# Patient Record
Sex: Female | Born: 1975 | Hispanic: No | Marital: Married | Smoking: Never smoker
Health system: Southern US, Community
[De-identification: ages and names within clinical notes are randomized; demographics above are authoritative.]

## PROBLEM LIST (undated history)

## (undated) DIAGNOSIS — K2970 Gastritis, unspecified, without bleeding: Secondary | ICD-10-CM

## (undated) DIAGNOSIS — N189 Chronic kidney disease, unspecified: Secondary | ICD-10-CM

## (undated) HISTORY — PX: CHOLECYSTECTOMY: SHX55

## (undated) HISTORY — DX: Chronic kidney disease, unspecified: N18.9

## (undated) HISTORY — PX: TUBAL LIGATION: SHX77

## (undated) HISTORY — PX: KIDNEY TRANSPLANT: SHX239

---

## 1998-12-31 ENCOUNTER — Emergency Department (HOSPITAL_COMMUNITY): Admission: EM | Admit: 1998-12-31 | Discharge: 1998-12-31 | Payer: Self-pay | Admitting: Internal Medicine

## 1999-01-07 ENCOUNTER — Ambulatory Visit (HOSPITAL_COMMUNITY): Admission: RE | Admit: 1999-01-07 | Discharge: 1999-01-07 | Payer: Self-pay | Admitting: Gynecology

## 1999-01-07 ENCOUNTER — Other Ambulatory Visit: Admission: RE | Admit: 1999-01-07 | Discharge: 1999-01-07 | Payer: Self-pay | Admitting: Gynecology

## 1999-01-07 ENCOUNTER — Encounter: Payer: Self-pay | Admitting: Gynecology

## 2000-03-27 ENCOUNTER — Encounter: Admission: RE | Admit: 2000-03-27 | Discharge: 2000-03-27 | Payer: Self-pay | Admitting: Sports Medicine

## 2000-04-16 ENCOUNTER — Encounter: Admission: RE | Admit: 2000-04-16 | Discharge: 2000-04-16 | Payer: Self-pay | Admitting: Family Medicine

## 2000-05-04 ENCOUNTER — Encounter: Admission: RE | Admit: 2000-05-04 | Discharge: 2000-05-04 | Payer: Self-pay | Admitting: Family Medicine

## 2000-05-07 ENCOUNTER — Inpatient Hospital Stay (HOSPITAL_COMMUNITY): Admission: EM | Admit: 2000-05-07 | Discharge: 2000-05-11 | Payer: Self-pay | Admitting: Emergency Medicine

## 2000-05-07 ENCOUNTER — Encounter (INDEPENDENT_AMBULATORY_CARE_PROVIDER_SITE_OTHER): Payer: Self-pay | Admitting: *Deleted

## 2000-05-08 ENCOUNTER — Encounter: Payer: Self-pay | Admitting: Family Medicine

## 2000-05-22 ENCOUNTER — Encounter (HOSPITAL_COMMUNITY): Admission: RE | Admit: 2000-05-22 | Discharge: 2000-05-29 | Payer: Self-pay | Admitting: *Deleted

## 2000-06-05 ENCOUNTER — Ambulatory Visit: Admission: RE | Admit: 2000-06-05 | Discharge: 2000-06-05 | Payer: Self-pay | Admitting: Obstetrics

## 2000-06-12 ENCOUNTER — Encounter (HOSPITAL_COMMUNITY): Admission: RE | Admit: 2000-06-12 | Discharge: 2000-08-13 | Payer: Self-pay | Admitting: *Deleted

## 2000-07-17 ENCOUNTER — Inpatient Hospital Stay (HOSPITAL_COMMUNITY): Admission: AD | Admit: 2000-07-17 | Discharge: 2000-07-17 | Payer: Self-pay | Admitting: *Deleted

## 2000-08-07 ENCOUNTER — Inpatient Hospital Stay (HOSPITAL_COMMUNITY): Admission: AD | Admit: 2000-08-07 | Discharge: 2000-08-07 | Payer: Self-pay | Admitting: *Deleted

## 2000-08-07 ENCOUNTER — Encounter (INDEPENDENT_AMBULATORY_CARE_PROVIDER_SITE_OTHER): Payer: Self-pay

## 2000-08-07 ENCOUNTER — Encounter: Payer: Self-pay | Admitting: *Deleted

## 2000-08-07 ENCOUNTER — Inpatient Hospital Stay (HOSPITAL_COMMUNITY): Admission: AD | Admit: 2000-08-07 | Discharge: 2000-08-13 | Payer: Self-pay | Admitting: *Deleted

## 2000-08-09 ENCOUNTER — Encounter: Payer: Self-pay | Admitting: Obstetrics & Gynecology

## 2000-08-10 ENCOUNTER — Encounter: Payer: Self-pay | Admitting: Obstetrics & Gynecology

## 2000-08-12 ENCOUNTER — Encounter: Payer: Self-pay | Admitting: Obstetrics & Gynecology

## 2000-08-15 ENCOUNTER — Inpatient Hospital Stay (HOSPITAL_COMMUNITY): Admission: AD | Admit: 2000-08-15 | Discharge: 2000-08-20 | Payer: Self-pay | Admitting: Obstetrics

## 2000-08-15 ENCOUNTER — Encounter: Payer: Self-pay | Admitting: Obstetrics

## 2000-08-17 ENCOUNTER — Encounter: Payer: Self-pay | Admitting: Nephrology

## 2000-08-20 ENCOUNTER — Encounter: Payer: Self-pay | Admitting: Internal Medicine

## 2000-08-30 ENCOUNTER — Encounter: Admission: RE | Admit: 2000-08-30 | Discharge: 2000-08-30 | Payer: Self-pay | Admitting: Family Medicine

## 2000-08-30 ENCOUNTER — Ambulatory Visit (HOSPITAL_COMMUNITY): Admission: RE | Admit: 2000-08-30 | Discharge: 2000-08-30 | Payer: Self-pay | Admitting: Family Medicine

## 2000-09-16 ENCOUNTER — Emergency Department (HOSPITAL_COMMUNITY): Admission: EM | Admit: 2000-09-16 | Discharge: 2000-09-16 | Payer: Self-pay | Admitting: Emergency Medicine

## 2000-09-16 ENCOUNTER — Encounter: Payer: Self-pay | Admitting: Emergency Medicine

## 2000-09-17 ENCOUNTER — Ambulatory Visit (HOSPITAL_COMMUNITY): Admission: RE | Admit: 2000-09-17 | Discharge: 2000-09-17 | Payer: Self-pay | Admitting: Nephrology

## 2000-09-19 ENCOUNTER — Ambulatory Visit (HOSPITAL_COMMUNITY): Admission: RE | Admit: 2000-09-19 | Discharge: 2000-09-19 | Payer: Self-pay | Admitting: Critical Care Medicine

## 2000-09-20 ENCOUNTER — Encounter: Payer: Self-pay | Admitting: Nephrology

## 2000-09-20 ENCOUNTER — Ambulatory Visit (HOSPITAL_COMMUNITY): Admission: RE | Admit: 2000-09-20 | Discharge: 2000-09-20 | Payer: Self-pay | Admitting: Nephrology

## 2000-10-03 ENCOUNTER — Encounter: Admission: RE | Admit: 2000-10-03 | Discharge: 2000-10-03 | Payer: Self-pay | Admitting: Family Medicine

## 2000-10-10 ENCOUNTER — Encounter: Payer: Self-pay | Admitting: Emergency Medicine

## 2000-10-11 ENCOUNTER — Inpatient Hospital Stay (HOSPITAL_COMMUNITY): Admission: EM | Admit: 2000-10-11 | Discharge: 2000-10-17 | Payer: Self-pay | Admitting: Emergency Medicine

## 2000-10-12 ENCOUNTER — Encounter: Payer: Self-pay | Admitting: Family Medicine

## 2000-10-16 ENCOUNTER — Encounter: Payer: Self-pay | Admitting: Family Medicine

## 2000-11-23 ENCOUNTER — Ambulatory Visit (HOSPITAL_COMMUNITY): Admission: RE | Admit: 2000-11-23 | Discharge: 2000-11-23 | Payer: Self-pay | Admitting: Nephrology

## 2000-12-13 ENCOUNTER — Ambulatory Visit (HOSPITAL_COMMUNITY): Admission: RE | Admit: 2000-12-13 | Discharge: 2000-12-13 | Payer: Self-pay | Admitting: Vascular Surgery

## 2001-01-10 ENCOUNTER — Ambulatory Visit (HOSPITAL_COMMUNITY): Admission: RE | Admit: 2001-01-10 | Discharge: 2001-01-10 | Payer: Self-pay | Admitting: *Deleted

## 2001-02-12 ENCOUNTER — Ambulatory Visit (HOSPITAL_COMMUNITY): Admission: RE | Admit: 2001-02-12 | Discharge: 2001-02-12 | Payer: Self-pay | Admitting: Nephrology

## 2001-08-09 ENCOUNTER — Encounter: Admission: RE | Admit: 2001-08-09 | Discharge: 2001-08-09 | Payer: Self-pay | Admitting: Obstetrics & Gynecology

## 2001-08-13 ENCOUNTER — Encounter: Payer: Self-pay | Admitting: Obstetrics & Gynecology

## 2001-08-13 ENCOUNTER — Ambulatory Visit (HOSPITAL_COMMUNITY): Admission: RE | Admit: 2001-08-13 | Discharge: 2001-08-13 | Payer: Self-pay | Admitting: Obstetrics & Gynecology

## 2001-08-13 ENCOUNTER — Encounter: Admission: RE | Admit: 2001-08-13 | Discharge: 2001-08-13 | Payer: Self-pay | Admitting: Obstetrics & Gynecology

## 2001-08-15 ENCOUNTER — Encounter (INDEPENDENT_AMBULATORY_CARE_PROVIDER_SITE_OTHER): Payer: Self-pay | Admitting: *Deleted

## 2001-08-15 ENCOUNTER — Ambulatory Visit (HOSPITAL_COMMUNITY): Admission: RE | Admit: 2001-08-15 | Discharge: 2001-08-15 | Payer: Self-pay | Admitting: Obstetrics & Gynecology

## 2001-10-08 ENCOUNTER — Encounter: Admission: RE | Admit: 2001-10-08 | Discharge: 2001-10-08 | Payer: Self-pay | Admitting: Obstetrics & Gynecology

## 2001-11-12 ENCOUNTER — Encounter: Admission: RE | Admit: 2001-11-12 | Discharge: 2001-11-12 | Payer: Self-pay | Admitting: Obstetrics & Gynecology

## 2003-09-30 ENCOUNTER — Inpatient Hospital Stay (HOSPITAL_COMMUNITY): Admission: AD | Admit: 2003-09-30 | Discharge: 2003-10-02 | Payer: Self-pay | Admitting: Family Medicine

## 2003-10-12 ENCOUNTER — Encounter: Admission: RE | Admit: 2003-10-12 | Discharge: 2003-10-12 | Payer: Self-pay | Admitting: Family Medicine

## 2003-10-19 ENCOUNTER — Encounter: Admission: RE | Admit: 2003-10-19 | Discharge: 2003-10-19 | Payer: Self-pay | Admitting: Family Medicine

## 2003-12-31 ENCOUNTER — Inpatient Hospital Stay (HOSPITAL_COMMUNITY): Admission: EM | Admit: 2003-12-31 | Discharge: 2004-01-05 | Payer: Self-pay | Admitting: Emergency Medicine

## 2007-01-31 DIAGNOSIS — N19 Unspecified kidney failure: Secondary | ICD-10-CM | POA: Insufficient documentation

## 2010-11-23 ENCOUNTER — Ambulatory Visit: Payer: Self-pay | Admitting: Obstetrics and Gynecology

## 2010-11-23 ENCOUNTER — Encounter (INDEPENDENT_AMBULATORY_CARE_PROVIDER_SITE_OTHER): Payer: Self-pay | Admitting: *Deleted

## 2010-11-23 LAB — CONVERTED CEMR LAB
HCV Ab: NEGATIVE
HIV: NONREACTIVE
Hepatitis B Surface Ag: NEGATIVE

## 2010-12-20 ENCOUNTER — Encounter
Admission: RE | Admit: 2010-12-20 | Discharge: 2010-12-20 | Payer: Self-pay | Source: Home / Self Care | Attending: Nephrology | Admitting: Nephrology

## 2011-01-18 ENCOUNTER — Other Ambulatory Visit: Payer: Self-pay | Admitting: Nephrology

## 2011-01-18 ENCOUNTER — Ambulatory Visit
Admission: RE | Admit: 2011-01-18 | Discharge: 2011-01-18 | Disposition: A | Discharge status: Home / Self Care | Payer: Self-pay | Source: Ambulatory Visit | Attending: Nephrology | Admitting: Nephrology

## 2011-01-18 DIAGNOSIS — R05 Cough: Secondary | ICD-10-CM

## 2011-01-18 DIAGNOSIS — R059 Cough, unspecified: Secondary | ICD-10-CM

## 2011-04-21 NOTE — Op Note (Signed)
[place]. [facility]  Patient:    Sarah Dyer, Sarah Dyer                      MRN: [id number] Proc. Date: 12/13/00 Adm. Date:  [id number] Attending:  Alyson Locket                           Operative Report  PREOPERATIVE DIAGNOSIS:  Chronic renal failure.  POSTOPERATIVE DIAGNOSIS:  Chronic renal failure.  PROCEDURE:  Left upper arm arteriovenous fistula.  SURGEON:  Di Kindle. Edilia Bo, M.D.  ASSISTANT:  Areta Haber, P.A.  ANESTHESIA:  Local with sedation.  DESCRIPTION OF PROCEDURE:  The patient was taken to the operating room and sedated by anesthesia.  The left upper extremity was prepped and draped in the usual sterile fashion.  After the skin was anesthetized with 1% lidocaine, a longitudinal incision was made over the cephalic vein at the wrist.  The vein at this level was very small, and therefore I abandoned this.  This wound was closed with a deep layer of 3-0 Vicryl and the skin closed with 4-0 Vicryl. Next, after the skin was anesthetized, a transverse incision was made below the antecubital level and here, the cephalic vein was larger and had multiple branches, and I selected a larger branch to use for the outflow.  The brachial artery was dissected free beneath the fascia.  I was small; however, upon interrogation it was clear that this was the brachial artery.  I irrigated this with papaverine, and it was felt that this was of adequate size.  The patient was then heparinized.  The brachial artery was clamped proximally and distally, and a longitudinal arteriotomy was made.  The vein was mobilized over and sewn end-to-side to the brachial artery using continuous 6-0 Prolene suture.  At the completion, there was an excellent thrill in the fistula. Hemostasis was obtained in the wound, and the wounds were closed with a deep layer of 3-0 Vicryl and the skin closed with 4-0 Vicryl.  A sterile dressing was applied.  The patient tolerated  the procedure well and was transferred to the recovery room in satisfactory condition.  All needle and sponge counts were correct. DD:  12/13/00 TD:  12/13/00 Job: [id number]

## 2011-04-21 NOTE — H&P (Signed)
[place]. [facility]  Patient:    Sarah Dyer, Sarah Dyer                      MRN: [id number] Adm. Date:  [id number] Attending:  McDiarmid, Leighton Roach. Dictator:   Doren Custard, M.D. CC:         Melba Coon, M.D.   History and Physical  ACUTE PROBLEM LIST:  1. Fever/bilateral lower quadrant pain/cervical motion tenderness/adnexa]     tenderness/costovertebral angle tenderness bilaterally.  2. Leukopenia.  3. Tachycardia.  4. Hypokalemia.  5. Elevated liver enzymes.  CHRONIC PROBLEM LIST:  1. Chronic renal failure.  2. Methicillin-resistant Staphylococcus.  The patient was on vancomycin and     now on tobramycin.  3. Immune complex glomerulonephritis, which caused #1.  HISTORY OF PRESENT ILLNESS: This patient is a 35 year old Hispanic female with chronic renal failure who presents from dialysis today after acute onset of fever, nausea, vomiting, diarrhea, abdominal, and low back pain.  The patient reports she was feeling fine at noon today and then developed acute onset of the above symptoms.  She is unsure of possible etiology.  PAST MEDICAL HISTORY: The patient was diagnosed with immune complex glomerulonephritis during her pregnancy this summer.  This progressed to end-stage renal failure during the pregnancy and she was on dialysis for the last couple of months of pregnancy, and has been on dialysis a total of four to five months.  She was healthy prior to that.  REVIEW OF SYSTEMS: She reports vaginal discharge which has been white for the past three days.  She has had no change in urinary characteristics, particularly with pain, frequency, or amount of urine.  She had no change in bowel pattern until this afternoon when she developed some diarrhea.  SOCIAL HISTORY: The patient lives in [place], [place] and has been married for seven years and has a six-year-old child and a two-month-old baby, who has been in the NICU.  FAMILY  HISTORY: She denies any family medical problems, most specifically renal disease.  CURRENT MEDICATIONS:  1. Renagel 800 mg b.i.d. with meals.  2. Nephro-Vite q.d.  3. InFeD 100 mg every Wednesday.  4. Epogen 10,000 units with each treatment.  5. Calcijex 0.5 mcg Monday, Wednesday, and Friday.  ALLERGIES: No known drug allergies.  PHYSICAL EXAMINATION:  VITAL SIGNS: Initial temperature was 102.9 degrees, down to 99.9 degrees. Respiratory rate 18.  Heart rate initially 180, down to 115.  Blood pressure initially 110/55, now with systolic in the 70s range.  Oxygen saturation 100% on room air.  GENERAL: This is a nontoxic appearing female, in no apparent distress.  She is alert and oriented x 4, mentating well.  She is cooperative throughout the examination.  HEENT: PERRL.  EOMI.  Oropharynx clear.  NECK: Supple.  CHEST: Clear to auscultation bilaterally with no rhonchi, rales, or wheezes.  HEART: Tachycardic.  No murmur.  ABDOMEN: Soft, positive bowel sounds.  Bilateral lower quadrant tenderness. No rebound or guarding.  No peritoneal signs.  No upper quadrant or suprapubic tenderness.  Prominent CVA tenderness bilaterally.  No masses.  The abdomen is nondistended.  GU: Examination per emergency department personnel shows cervical motion tenderness and bilateral adnexal tenderness, and some whitish discharge noted.  EXTREMITIES: No clubbing, cyanosis, or edema.  No rash.  LABORATORY DATA: WBC 2.1, hemoglobin 11.8, hematocrit 34.8; platelets 221,000. Sodium 140, potassium 2.8, chloride 106, bicarbonate 24, BUN 13, creatinine 3.1, glucose 100.  AST 171, ALT  75.  Calcium 8.4, alkaline phosphatase 113, total bilirubin 1.1, total protein 5.7, albumin 3.9.  ASSESSMENT/PLAN: The patient is a 35 year old female with end-stage renal disease, with fever and abdominal/pelvic pain and tenderness.  1. Fever and bilateral lower quadrant tenderness and CVA tenderness.  This is      concerning for infectious etiology (she was recently switched from     vancomycin to tobramycin with dialysis today).  Probable etiologies     include PID/TOA/Fitz-Hugh-Curtis versus perinephric abscess or other     retroperitoneal etiology versus appendicitis, which seems less likely     at this point, versus a gastroenteritis.  She has received Cefotetan     in the emergency department and we will also add Flagyl.  In addition     we will image the abdomen starting off with a pelvic ultrasound and if     that is not diagnostic will consider abdominal/pelvic CT.  2. Tachycardia and hypotension.  This appears to be sepsis.  We will treat     her aggressively with intravenous fluids and continue broad-spectrum     antibiotics and monitor closely in a step-down bed.  3. Leukopenia, probably related to sepsis but may consider rechecking HIV.     Reportedly she has been HIV negative during pregnancy.  4. Follow electrolytes closely.  She has received p.o. potassium in the     emergency department.  5. Dr. Lowell Guitar with the renal service is aware of the case and he will be     consulted in the a.m.  6. Increased liver function tests again pointing to the possibility of     Fitz-Hugh-Curtis.  Will follow. DD:  10/11/00 TD:  10/11/00 Job: [id number]

## 2011-04-21 NOTE — H&P (Signed)
[facility]  Patient:    Sarah Dyer, Sarah Dyer Visit Number: [id number] MRN: [id number]          Service Type: Attending:  Roseanna Rainbow, M.D. Dictated by:   Roseanna Rainbow, M.D.   CC:         [facility]   History and Physical  CHIEF COMPLAINT:              The patient is a 35 year old with an 8-week intrauterine pregnancy complicated by severe renal insufficiency and chronic dialysis treatment, who requests an elective termination.  HISTORY OF PRESENT ILLNESS:   As above.  An ultrasound study performed on August 13, 2001, demonstrated a viable intrauterine pregnancy with a crown-rump length of 8 weeks 3 days.  ALLERGIES:                    No known drug allergies.  MEDICATIONS:                  Ciprofloxacin.  PAST OBSTETRICAL HISTORY:     She is status post a spontaneous vaginal delivery and a cesarean delivery.  She denies any sexually transmitted diseases.  Pap smears within normal limits as per patient.  PAST MEDICAL HISTORY:         Chronic renal insufficiency on dialysis, questionable etiology.  PAST SURGICAL HISTORY:        As above.  FAMILY HISTORY:               She denies.  SOCIAL HISTORY:               No tobacco, ethanol, or drug use.  PHYSICAL EXAMINATION:         Pulse 80, blood pressure 130/50.  WEIGHT:                       101.4 pounds.  GENERAL APPEARANCE:           A thin, pleasant woman in no apparent distress.  HEENT:                        Normocephalic and atraumatic.  NECK:                         Supple.  LUNGS:                        Clear to auscultation bilaterally.  HEART:                        Regular rate and rhythm.  ABDOMEN:                      Soft and nontender.  No organomegaly.  PELVIC:                       The external female genitalia are normal appearing.  On speculum exam, the vagina is clear.  On bimanual exam, the uterus is approximately 8-10 week size and  anteverted.  The adnexa are palpable and nontender.  EXTREMITIES:                  No clubbing, cyanosis, or edema.  SKIN:  Without rash.  ASSESSMENT:                   Early intrauterine pregnancy complicated by maternal medical problems.  The patient requests an elective termination.  PLAN:                         Suction dilatation and curettage.  Possible IUD placement. Dictated by:   Roseanna Rainbow, M.D. Attending:  Roseanna Rainbow, M.D. DD:  08/13/01 TD:  08/13/01 Job: [id number]

## 2011-04-21 NOTE — Discharge Summary (Signed)
[place]. [facility]  Patient:    Sarah Dyer, Sarah Dyer                      MRN: [id number] Adm. Date:  [id number] Disc. Date: [id number] Attending:  McDiarmid, Leighton Roach. Dictator:   Loyal Jacobson, M.S.-IV CC:         [facility]  Lyndee Leo. Foreman   Discharge Summary  DISCHARGE DIAGNOSES: 1. 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 2. History of methacillin-resistant Staphylococcus, questionable secondary    to fungemia. 3. Gallstones seen on ultrasound. 4. Stomatitis. 5. Normocytic anemia.  DISCHARGE MEDICATIONS: 1. Diflucan 100 mg p.o. q.d. 2. Tequin 200 mg p.o. q.d. x 3 days for a total of a 10-day course. 3. Epogen shots every week at the dialysis center. 4. Renagel 800 mg b.i.d. 5. Nephro-Vite p.o. q.d. 6. Bactroban to catheter site.  CONSULTATIONS:  The renal service consulted on the patient for monitoring of her end-stage renal disease and supervision of her dialysis.  PROCEDURES: 1. Pelvic ultrasound revealed a 2.6 cm hypoechoic structure in the    endometrial cavity with probable diagnosis of blood clot or endometrial    polyp. 2. Abdominal ultrasound revealed gallstones. 3. The patient had a temporary internal jugular dialysis catheter placed on    October 16, 2000.  CHIEF COMPLAINT:  Nausea, vomiting, diarrhea, and abdominal and back pain.  HISTORY OF PRESENT ILLNESS:  A 35 year old Hispanic female with chronic renal failure, who presented from dialysis with acute onset of fever along with nausea, vomiting, and abdominal pain.  The patient reports that she was feeling fine up until the acute onset of symptoms on the day of admission. The patient had been on vancomycin after her dialysis treatments for treatment of methacillin-resistant Staphylococcus aureus.  The patient had recurrent infections with MRSA and had also had a history of positive blood culture for yeast.  HOSPITAL COURSE: #1 -  SEPSIS:  The patient recorded a temperature of 102.7 degrees while in the emergency room.  She reported pain all over her body at this time.  Upon examination, her abdomen was diffusely tender to deep palpation, most prominently over the right upper quadrant.  The pelvic exam revealed cervical motion tenderness.  Blood cultures were drawn in the hospital, which were negative throughout the course of hospitalization.  Cultures at the time of pelvic examination were done for gonorrhea or chlamydia.  The patient received a pelvic ultrasound on October 11, 2000, which revealed a 2.6 cm mass in the endometrial cavity, likely blood clot or endometrial polyp.  In addition, the patient received an abdominal ultrasound on October 16, 2000, which was positive for gallstones.  Initially her symptoms were most suggestive of pelvic inflammatory disease.  Therefore, she was started on a course of Flagyl and Tequin.  She continued to received Tequin throughout her hospitalization. On October 13, 2000, it was revealed that blood cultures drawn as an outpatient on October 10, 2000, had grown positive for yeast.  Her inpatient blood cultures were negative.  At this time, her diagnosis seemed most likely related to a fungal sepsis secondary to catheter infection.  At this time, her temporary catheter was removed and the patient was begun on Diflucan treatment.  The catheter remained out for three days.  During this time, the patient reported that she was feeling better and was not in any pain and was nontender, except to the deepest palpation on exam.  On October 16, 2000,  a new temporary IJ catheter was placed for the patients discharge.  #2 - CHRONIC RENAL FAILURE:  The patient was diagnosed with immune complex glomerulonephritis during her pregnancy earlier in the year.  This quickly progressed to end-stage renal disease during the pregnancy and she has been on dialysis for four to five months prior to  hospitalization.  The patient required hemodialysis during her hospital course and will continue to require hemodialysis.  Her renal status was monitored by the renal consultation service.  #3 - STOMATITIS:  The patient was treated with Majik mouthwash and was noted to have resolving oral lesions with her topical treatment during her hospitalization.  DISCHARGE MEDICATIONS:  The patient was to continue her course of Tequin for three more days for treatment of possible PID.  In addition, the patient was to maintain treatment of her septicemia with Diflucan at 100 mg q.d. for a total of three weeks.  FOLLOW-UP:  She was to maintain her hemodialysis schedule on Monday, Wednesday, and Friday.  The patient is to follow up with then renal service at [facility] as her primary issue is her end-stage renal disease.  She was instructed to follow up with the family practice center as needed for other medical issues. DD:  10/20/00 TD:  10/21/00 Job: [id number]

## 2011-04-21 NOTE — Discharge Summary (Signed)
[place]. [facility]  Patient:    Sarah Dyer, Sarah Dyer                      MRN: [id number] Adm. Date:  [id number] Disc. Date: 05/10/00 Attending:  Sanjuana Dyer Dictator:   Sarah Dyer                           Discharge Summary  DATE OF BIRTH:  Aug 15, 1976  DISCHARGE DIAGNOSES: 1. Renal failure. 2. Intrauterine pregnancy at 16-0/7 weeks (by ultrasound at 15-5/7 weeks). 3. Anemia (hemoglobin on admission 5.9 and the hemoglobin after transfusion of    two units of packed red blood cells was 9.4). 4. Pyuria. 5. Hyperphosphatemia.  DISCHARGE MEDICATIONS:  PhosLo 667 mg two tablets p.o. with meals.  PROCEDURES:  1. OB ultrasound which revealed a single intrauterine gestation with an estimated gestational age of 15-5/7 days performed on May 08, 2000. 2. Renal ultrasound showing small and diffusely hyperechoic kidneys compatible with medical renal disease.  The right kidney was 10.2 cm in length and the left kidney was 9.4 cm in length.  Multiple gallstones were noted in the gallbladder.  She had no hydronephrosis.  HISTORY OF PRESENT ILLNESS:  This is a 35 year old Hispanic female with no significant past medical history, who had a last menstrual period of January 26, 2000.  She was seen at the family practice center for routine prenatal labs and found to be extremely anemic and have a creatinine of 4.9. She was subsequently admitted for further work-up.  PAST MEDICAL HISTORY:  Of note, she has no family history of renal disease and had an uncomplicated pregnancy approximately five years ago.  There is no history of hypertension, diabetes, drug abuse, or taking nonsteroidal anti-inflammatories or other medications.  FAMILY HISTORY:  She has two brothers and sisters, who are healthy.  HOSPITAL COURSE:  #1 - RENAL FAILURE:  The patient underwent a work-up to assess the etiology of her renal failure, which included compliment studies which are normal,  an ANA which was negative, an ASO which was normal, and a renal ultrasound which revealed somewhat small and diffusely hyperechoic kidneys.  The patients creatinine at the time of discharge was 5.3.  There was no clear etiology of renal failure established at the time of discharge, although it was felt that it looked to be more likely a chronic rather than acute process.  At the time of her discharge, her creatinine was 5.3 and she had some slightly labile blood pressures in the 100s to high 130s/50s-90s range.  Of note, the patient was asymptomatic the entire time. Her creatinine clearance was 8 and 24-hour urine for protein was approximately 2.7 g.  #2 - INTRAUTERINE PREGNANCY:  After a consultation with Sarah Dyer, M.D., obstetrician, as well as the renal service, it was felt that the pregnancy would place a significant stress on Sarah Dyer kidney function. Furthermore, it was felt that she was highly unlikely to have any sort of good outcome from this pregnancy regarding the child.  It was felt that changes were slim that she would be able to carry this pregnancy to a viable age secondary to onset of fulminant preeclampsia requiring delivery.  This information was discussed at length with the patient and her husband.  After discussing the risk/benefit ratio and her options, she elected to terminate the pregnancy with a D&E and thus was transferred to [facility].  #  3 - ANEMIA:  The patient was transfused two units of packed red blood cells and her hemoglobin stabilized.  #4 - PYURIA:  A repeat urinalysis showed negative nitrite and negative leukocyte esterase with 21-50 white blood cells.  She was started on renal dose ampicillin.  Culture pending at the time of discharge.  #5 - HYPERPHOSPHATEMIA SECONDARY TO RENAL FAILURE:  Started on PhosLo.  DISCHARGE LABORATORY DATA:  Sodium 135, potassium 4.7, chloride 109, bicarbonate 19, BUN 67, creatinine 5.3, glucose 79,  alkaline phosphatase 6.3, calcium 8.3, magnesium 2.8.  ANA was negative.  ASO titer was normal at 64. C4 compliment normal at 34.  C3 compliment normal at 102.  Iron 61, total iron binding capacity 280, percent saturation 22.  White blood cell count 10, hemoglobin 9.4, hematocrit 26.1, platelets 258.  The urinalysis on May 07, 2000, revealed negative nitrite, negative leukocyte esterase, 21-50 white blood cells, 11-20 red blood cells, and waxy casts.  The creatinine clearance was 8 on a urine volume of 900 ml and urine creatinine of 60.3 mg/dl.  A 24-hour protein on a urine volume of 900 ml revealed 2673 mg of urine.  The LDH was 197.  The urine culture and neutrophil cystoplasmic antibody were pending at the time of discharge.  DISPOSITION:  The patient was transferred to [facility] to the medicine service there with a nephrology consult for further management.  In addition, the patient will have a D&E performed by the GYN service by Sarah Dyer, M.D., the attending. DD:  05/10/00 TD:  05/10/00 Job: [id number]

## 2011-04-21 NOTE — H&P (Signed)
NAMEDAMYAH, GUGEL                         ACCOUNT NO.:  [id number]   MEDICAL RECORD NO.:  [id number]                   PATIENT TYPE:  INP   LOCATION:  5528                                 FACILITY:  [facility]   PHYSICIAN:  Nilda Simmer, M.D.                  DATE OF BIRTH:  [birth year]   DATE OF ADMISSION:  09/30/2003  DATE OF DISCHARGE:                                HISTORY & PHYSICAL   CHIEF COMPLAINT:  Dizziness and vomiting.   SUBJECTIVE:  Ms. Sarah Dyer is a 35 year old Latino female who is presenting  secondary to a 2 week history of dizziness. The patient reports that she  developed anorexia and quit eating approximately 2 months prior to  presentation secondary to significant stress at home. The patient reports  that approximately 2 months previously, she separated from her husband.  Since that time, the patient is currently back with her husband and is  working on marital issues. The patient has continued to have anorexia with  loss of appetite and reports that when she tries to eat, she has  intermittent vomiting. The patient has lost approximately 10 pounds in the  past 1 to 2 months. The patient's baseline weight is 105. Last episode of  vomiting was 2 days prior to presentation. The patient has also developed  dizziness over the past 2 weeks and she feels it is secondary to decreased  p.o. intake. The patient did report that she was able to ingest mild, apple,  banana, and some [place] food on the day prior to presentation without  episodes of vomiting. The patient presented to [facility] on the day of  presentation secondary to current symptoms and was diagnosed with anemia  with a hemoglobin of 7.5. The patient was sent to [facility] for  evaluation of her anemia. Of note, the patient with a 34 to 4 year history of  chronic renal failure that was diagnosed with her last pregnancy.   The patient presented to [facility] just hours for  her prenatal care and was found to be anemic with a hemoglobin of 7.5 and a  creatinine of 4.9. 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 The patient delivered a  female infant at [redacted] weeks gestation via cesarean section and continued  dialysis in the postpartum period. The patient subsequently returned to  [place] approximately 1 year after the birth of her child. The patient did  not continue hemodialysis while in [place] and she reports that this was her  own decision. The patient reports that she has been handling her chronic  renal disease on her own with herbs and other remedies. The  patient reports  that she has been feeling very well without the hemodialysis for the past  year, until the past 1 to 2 months.   REVIEW OF SYSTEMS:  Positive dizziness for the past 2 weeks. Positive weight  loss of 10 pounds. Positive fatigue. No chest pain. Positive intermittent  palpitations. Positive shortness of breath. No cough. Positive nausea and  vomiting. No diarrhea. A bowel movement was day prior to presentation and  was normal. No bloody stools. No swelling or edema noted. No headache. No  rashes. No blurry vision. No pruritus. No fever. No joint pain. No cough,  rhinorrhea, or sore throat.   PAST MEDICAL HISTORY:  1. The patient is a G3, P1-1-1-2 status post 1 vaginal delivery of a female     infant, status post cesarean section of a 28 week infant and status post     1 spontaneous abortion.  2. Chronic renal failure that was diagnosed during her pregnancy in 2001. As     mentioned above, the patient underwent an extensive workup at that time.     A 24 hour urine in 2001 revealed 2673 mg of protein.  Ultrasound of the     kidney's revealed bilateral echogenic small kidney's. 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 While at [facility], the patient received     high dose Solu-Medrol at 500 mg IV q. day followed by Cytoxan     chemotherapy. The patient did receive hemodialysis for approximately 1     year after delivery of infant.  3. Anemia secondary to chronic renal insufficiency. The patient reports that     hemoglobin on September 04, 2003 was 7.3 in [place] and she was continued on     iron therapy.  4. Intrauterine device placement approximately 1 to 2 years previously.   MEDICATIONS:  Iron supplementation.   ALLERGIES:  No known drug allergies.   SOCIAL HISTORY:  The patient lives in an apartment in [place] with her  husband and 2 daughters, who are 58 and 41 years old. No tobacco or alcohol.  The patient does have a sister that lives in the area but she has not been  in contact yet. The patient returned to the [place] 15 days prior to  presentation. She is originally from [place].   FAMILY HISTORY:  Father is 48 years old with diabetes mellitus. Mother is 2  years old and healthy. The patient has 5 brothers and 3 sisters but all half  brothers and sisters. No cancer or heart problems or kidney problems in the  family.   PHYSICAL EXAMINATION:  VITAL SIGNS:  Temperature 96.7, pulse 91, blood  pressure 147/99, respiratory rate 20.  GENERAL:  A well developed, thin, Hispanic female in no acute distress. She  is laying in bed. She is alert and oriented x3. She is very thin appearing.  HEENT:  Normocephalic, atraumatic. Extraocular muscles intact. Oropharynx is  without erythema or exudate.  NECK:  Supple. No lymphadenopathy or thyromegaly.  CARDIOVASCULAR:  Regular rate and rhythm. With an active precordium. The patient has a normal S1 and a possible split S2 versus an S3. No rubs were   appreciated.  LUNGS:  Clear to auscultation bilaterally. No wheezing, rales, or rhonchi.  No tachypnea. No respiratory distress was evident.  ABDOMEN:  Soft, nondistended. Positive bowel sounds. Normal active. The  patient was slightly tender in the  right upper quadrant to deep palpation  and the liver edge was appreciated approximately 2 cm below the margin.  EXTREMITIES:  No clubbing, cyanosis, or edema. No erythema noted. The  patient does have a fistula near the left antecubital region with positive  thrill and bruit without evidence of erythema or edema at the fistula site.  NEUROLOGIC:  2+ patellar reflexes. Cranial nerves are grossly intact. No  asterixis appreciated.   LABORATORY DATA:  PTT of 32, PT 13.4, INR of 1.0. Lipase 41, amylase 169,  white blood cells 5.7, hemoglobin 7.7, hematocrit 22.5, platelets 231,000.  MCV of 84, RDW 13.6. Sodium 142, potassium 4.5, chloride 108, bicarb 18, BUN  145, creatinine 14.1. Glucose 119. SGOT 17, SGPT 23, total protein 6.2,  calcium 7.9, albumin 3.3.   ASSESSMENT/PLAN:  A 35 year old Latino female with past medical history  significant for chronic renal failure and anemia, presenting with a several  week history of dizziness, anorexia, nausea and vomiting.   1. Dizziness. Differential diagnoses includes dehydration, anemia, uremia.     We will admit the patient and evaluate orthostatics. The patient has had     decreased p.o. intake for several weeks. Component of dehydration may be     contributing to this current presentation. The patient does have a     chronic history of anemia and a hemoglobin of 7.7 is not far from her     baseline, thus making her anemia the primary cause is less likely.     However, it may be contributing in the face of dehydration. The patient     with an elevated BUN and creatinine suggestive highly of uremia, which     may be the primary contributing factor to her current dizziness.   1. Nausea and vomiting  with weight loss. Differential diagnoses includes     again uremia, a GI etiology, psychiatric etiology. LFT's as well as     amylase and lipase are within normal limits currently. Uremia most likely     diagnosis at this time. Monitor vomiting episodes during hospitalization.     However, I anticipate that this will improve with the improvement of her     uremia.   1. Chronic renal failure with a patient with an extensive workup in 2001.     The patient is status post fistula placement that was used to for     dialysis prior to her leaving back to [place]. The patient has been     without dialysis for approximately the past year and her current     presentation, could be secondary to active uremia. BUN and creatinine are     significantly elevated upon presentation. She is very well known to the     renal service here at [facility] and we will have them evaluate    the patient in the morning. There is no emergent need for hemodialysis at     this time and of note, there is no evidence of hyperkalemia, severe     acidosis, or volume overload. The patient currently is refusing further     hemodialysis. However, we will discuss this further with the patient and     have the assistance of renal service to assist [place] in this manner.   1. 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 Again, hemoglobin 7.7, which     appears to be close to patient's baseline. The patient could benefit from     a transfusion. However, we will  await evaluation by the renal service and     transfuse her per their discretion and ideally via dialysis as well. We     will check iron studies upon admission. This is a chronic issue in this     patient. Essentially, could be contributing to her current dizziness.   1. Social. The patient is predominantly Spanish speaking, which makes     language a significant barrier. We did meet with Tia Alert, the     translator on today's visit, which definitely assisted [place]  with history     taking. Tia Alert will return during hospitalization to help [place] to     communicate with Ms. Sarah Dyer. There are also significant cultural barriers     that will be challenging during this hospitalization.                                                Nilda Simmer, M.D.    KS/MEDQ  D:  09/30/2003  T:  10/01/2003  Job:  [id number]

## 2011-04-21 NOTE — Procedures (Signed)
[place]. [facility]  Patient:    SHAKEENA, KAFER Visit Number: [id number] MRN: [id number]          Service Type: DSU Location: *N Attending Physician:  Lurlean Nanny Proc. Date: 09/20/00   CC:         [facility]   Procedure Report  PROCEDURE:  Zachary George twin catheter insertion.  SURGEON:  James L. Deterding, M.D.  INDICATIONS:  Access for hemodialysis.  The procedure was explained to the patient through an interpreter.  Benefits and risks understood and accepted.  DESCRIPTION OF PROCEDURE:  The patient was taken to the fluoroscopy suite and placed on the fluoroscopy suite table in a supine position with a towel roll between her shoulders.  Initially, the _________ was used to localize the internal jugular vein and _______ in position in the middle one-third of the sternocleidomastoid triangle.  Then, the left side of the neck and left upper chest were prepped with Betadine.  Two percent Xylocaine local anesthesia was used to numb up the area over the sternocleidomastoid triangle, and in an inferolateral fashion, approximately 8 x 4 cm.  The anesthesia needle was used to localize the internal jugular vein at the previously localized position.  Subsequently, at that same skin opening, an 18-gauge thin wall needle was inserted into the internal jugular vein and a J-tip guidewire advanced through that 18-gauge thin wall needle into the internal jugular vein, innominate, superior vena cava, and inferior vena cava under fluoroscopic guidance.  The 18-gauge thin wall needle was withdrawn and then the opening where the guidewire went through the skin was dilated with sharp dissection approximately 1 cm.  Serial dilators were placed over the guidewires, starting with 10-French and then going to 12, 14, and 14-French to lessen the resistance in the skin, subcutaneous tissue, and vessel wall.  Subsequently, an 18-French trocar with  tear-away sheath was placed over the guidewire into the internal jugular vein and innominate under fluoroscopic guidance.  A nonserrated dialysis clamp was placed around the trocar and sheath just outside the skin.  The trocar was then withdrawn from the sheath, and the sheath clamped with a dialysis clamp.  Both lumens of a double lumen 16 cm Schon catheter flushed with heparin and screw-in tunneling device was placed in the distal ends.  The proximal end of the catheter was placed to the distal end of the sheath.  The sheath then clamped.  The catheter was advanced through the remainder of the sheath into the internal jugular vein, innominate, and superior vena cava with the tip coming to rest at the superior vena cava and right atrial junction.  The tear-away sheath was then removed.  The guidewire was removed also and position was confirmed once again.  The tunneling devices were then bent in 180 degree arch to facilitate tunnel formation.  Two parallel curvilinear tunnels were performed initially in a superior direction, and then lateral, and then inferior with the tips coming out 3 cm inferior to the clavicle, and 2 cm apart.  Position was confirmed and there were no kinks again.  The catheters were clamped, flushed, and friction screw-on connecting devices applied.  The caps were then flushed with saline and then concentration heparin and heparin locked.  The skin over the vein entry was closed with a 3-0 silk and pursestring suture was taken 1 cm from the two tunnel exit sites with 3-0 silk also.  Op-Site dressing was applied. Attending Physician:  Deterding, Chales Abrahams DD:  09/20/00 TD:  09/20/00 Job: [id number]

## 2011-04-21 NOTE — Op Note (Signed)
[facility]  Patient:    Sarah Dyer, Sarah Dyer Visit Number: [id number] MRN: [id number]          Service Type: DSU Location: [facility] Attending Physician:  Antionette Char Dictated by:   Roseanna Rainbow, M.D. Proc. Date: 08/15/01 Admit Date:  08/15/2001   CC:         [facility]   Operative Report  PREOPERATIVE DIAGNOSIS:       Eight week intrauterine pregnancy.  POSTOPERATIVE DIAGNOSIS:      Eight week intrauterine pregnancy.  OPERATION:                    Suction dilatation and curettage with repair of a cervical laceration.  SURGEON:                      Roseanna Rainbow, M.D.  ANESTHESIA:                   Managed anesthesia care, paracervical block.  COMPLICATIONS:                Cervical laceration.  ESTIMATED BLOOD LOSS:         50 cc.  FINDINGS:                     Eight to 10-week anteverted uterus, moderate products of conception.  DESCRIPTION OF PROCEDURE:     The patient was taken to the operating room.  A sterile speculum was placed in the patients vagina.  The cervix and vagina were swabbed with Betadine, and 2 cc of lidocaine were injected into the anterior lip of the cervix.  The single-tooth tenaculum was then applied to this location and 5 cc of lidocaine were then injected at 4 and 7 oclock to produce a paracervical block.  The cervix was then dilated with [facility] dilators.  During this process, the single-tooth tenaculum tore through the anterior lip of the cervix and was reapplied.  An 8 mm suction curette was then advanced gently to the uterine fundus.  The suction device was then activated and the curette rotated to clear the uterus of the products of conception.  The sharp curettage was then performed until a good _____ was noted.  The suction curette was then reintroduced to clear the uterus of the remaining products of conception.  The tenaculum was removed, and the above laceration was  repaired with figure-of-eight sutures of 2-0 chromic. Excellent hemostasis was noted.  The patient tolerated the procedure well. The patient is taken to the PACU in stable condition.  PATHOLOGY:                    Products of conception. Dictated by:   Roseanna Rainbow, M.D. Attending Physician:  Antionette Char DD:  08/15/01 TD:  08/15/01 Job: [id number]

## 2011-04-21 NOTE — Discharge Summary (Signed)
[place]. [facility] [place]  Patient:    Sarah Dyer, Sarah Dyer                      MRN: [id number] Adm. Date:  [id number] Disc. Date: [id number] Attending:  Willow Ora Dictator:   Michell Heinrich, M.D. CC:         Patricia Pesa, M.D.  Charles A. Clearance Coots, M.D.  Dyke Maes, M.D.   Discharge Summary  ADMITTING DIAGNOSES: 1. Chronic renal failure. 2. Pneumonia.  DISCHARGE DIAGNOSES: 1. Chronic renal failure. 2. Pneumonia.  SERVICE:  Family practice teaching service (transferred on September 13 from the [facility] teaching service).  CONSULTS:  The patient was followed by the renal team during this hospitalization.  PROCEDURES:  None.  BRIEF HISTORY OF PRESENT ILLNESS:  The patient is a 35 year old, G2, P1-1-0-2, who is status post low transverse C-section on August 10, 2000, and presented to [facility] maternal admissions unit on postoperative day #6 with a chief complaint of fever to 104 and productive cough.  The patient has known chronic renal insufficiency secondary to immune complex glomerulonephritis, and had been in dialysis throughout pregnancy daily.  She had been in dialysis the day that she presented to maternal admissions unit with this fever, and, in fact, the fever started just after dialysis.  A physical exam at that time was normal with the exception of minimal tenderness at the abdominal incision with an approximately 2-3 cm opening of the incision to the fascia, and a temperature of 102.3, pulse of 101.  Chest x-ray obtained at that time showed a left lower lobe infiltrate which was also read as possible atelectasis.  Blood cultures were drawn x 2.   The patient was admitted and started on IV Zosyn for diagnosis of pneumonia.  Due to the difficulty of obtaining dialysis from [facility], the patient was transferred from the [facility] teaching service to the family practice teaching service under Dr. Mardelle Matte.  She was followed by the  renal team and received dialysis on Friday the 14th and Monday the 17th.  HOSPITAL COURSE: #1 - INFECTIOUS DISEASE:  The patient was febrile to a temperature of 102.3 at presentation at maternal admissions at [facility].  Her temperature did go up to 103.1 while at that hospital.  However, she defervesced after transfer to [facility] and did not have a fever during her hospital stay.  White blood cell count obtained at presentation was 12.9.  It increased to 15.2 on the 13th of September and was 17.6 on the day of discharge.  The patient did not display a productive cough during the hospitalization.  Her chest x-ray obtained at presentation did show a minimal left lower lobe infiltrate versus possible atelectasis, and this was shown to improve on subsequent x-ray on August 17, 2000.  Blood cultures were obtained at presentation and were negative at five days.  The patient was started on Zosyn at presentation, and Vancomycin was added to this on the second hospital day.  The possibility of central line catheter infection was discussed, and the renal team had planned to pull the line if blood cultures were positive.  However, they remained negative, and the patient did not spike another temperature during the hospital stay, so that line was left in, and hemodialysis was continued on a Monday, Wednesday, Friday schedule.  As per the renal team recommendation, the patient was discharged home on Augmentin p.o. 500 mg b.i.d. to finish a 10-day  course of antibiotics.  The patients abdominal wound did have minimal skin and subcutaneous tissue separation, but the fascia was intact.  There was no excessive drainage from the wound, and it was only minimally tender.  It was thought that the fever was most likely not due to a wound infection, and the wound was wet to dry dressing daily, and home health nursing was set up to do dressing changes at home on a daily basis until the  wound healed by second intention.  #2 - RENAL:  The patient carried a diagnosis of immune complex glomerulonephritis.  She had been receiving daily hemodialysis during pregnancy and was to continue hemodialysis after pregnancy as needed.  She was followed by the renal team during this hospitalization.  Creatinine at presentation was 1.5.  This increased to 2.6 on hospital day #2, 3.6 on day #3, after which she was hemodialyzed.  Then over the next two days, the patients creatinine elevated to 5.2, and she was hemodialyzed again.  At this point, it was determined that she would need to be on Monday, Wednesday, Friday schedule for hemodialysis.  The patient had been on daily steroids during the pregnancy for her renal disease.  During this hospitalization, she was on a tapered steroid dose.  Her dose was as follows:  20 mg per day x 3 days, 15 mg per day x 3 days, then 10 mg a day x 3 days, and she was discharged home on the schedule of 5 mg for 3 days.  SUMMARY:  This 34 year old patient who is postoperative day 10 from low transverse C-section and had chronic renal insufficiency was here for treatment of pneumonia.  She she had defervesced, had not had any temperature spikes; her chest x-ray showed improvement, and her blood cultures remained negative, it was determined that she was ready to be discharged home on oral antibiotics and follow-up with hemodialysis on Monday, Wednesday, Friday schedule and follow up with her primary care physician, Dr. Lance Bosch, at [facility] family practice.  CONDITION ON DISCHARGE:  She was discharged home in fair condition.  DISCHARGE MEDICATIONS: 1. Nephro-Vite vitamin 1 tablet per day. 2. Calcium 500 mg tabs 3 times a day with meals. 3. InFeD 100 mg IV for every hemodialysis. 4. _________ 800 mg 1 tablet with meals. 5. Augmentin 500 mg 1 tab twice a day for 5 days.  6. Prednisone 10 mg 1 tab by mouth per day for 1 day, then prednisone 5 mg 1    tab by  mouth for 3 days.  The patient was set up for hemodialysis on Monday, Wednesday, Friday basis, and this was set up for [facility], and her access was to be via her right PermCath.  FOLLOW-UP:  Office primary care follow-up was set up with Dr. Lance Bosch at [facility] family practice on August 30, 2000, at 10:25 a.m.  DISCHARGE INSTRUCTIONS:  The patient was instructed to call her doctor or return to the ED if she had a temperature greater than 100.4, if her wound had increased bloody drainage, or any increased wound separation, and she was also instructed to call her MD or go to the emergency department if she had any problems with her dialysis catheter including pain, redness, drainage, or if the catheter should fall out.  Home health was arranged for dressing changes to the abdominal wound and for dressing changes to the hemodialysis catheter. DD:  08/20/00 TD:  08/21/00 Job: [id number]

## 2011-04-21 NOTE — Op Note (Signed)
[place]. [facility]  Patient:    CANDEE, HOON                      MRN: [id number] Proc. Date: 10/16/00 Adm. Date:  [id number] Attending:  McDiarmid, Leighton Roach.                           Operative Report  PREOPERATIVE DIAGNOSIS:  Chronic renal failure.  POSTOPERATIVE DIAGNOSIS:  Chronic renal failure.  PROCEDURE:  Placement of right internal jugular Ash catheter.  SURGEON:  Di Kindle. Edilia Bo, M.D.  ASSISTANT:  Nurse.  ANESTHESIA:  Local with sedation.  DESCRIPTION OF PROCEDURE:  The patient was taken to the operating room and sedated by anesthesia.  The neck and upper chest were prepped and draped in the usual sterile fashion.  After the skin was anesthetized with 1% lidocaine, the right internal jugular vein was cannulated and the guidewire introduced into the superior vena cava under fluoroscopic control.  The exit site for the catheter was selected and the skin anesthetized between the two areas.  A 24 cm catheter was then passed between the two incisions with the cuff positioned in the midportion of the tunnel.  I then dilated the tract with a 12 and a 14 [place] dilator.  I then tried to pass the dilator and peel-away sheath over the wire; however, the sheath began to shred some and therefore rather than force it, I got a new dilator and passed the new dilator and sheath over the wire.  The wire and dilator were removed.  The catheter was passed through the peel-away sheath and positioned in the right atrium.  Both ports withdrew easily and were then flushed with heparinized saline and filled with concentrated heparin.  The catheter was secured at its exit site with a 3-0 nylon suture.  The IJ cannulation site was closed with a 4-0 subcuticular stitch.  A sterile dressing was applied.  The patient tolerated the procedure well and was transferred to the recovery room in satisfactory condition.  All needle and sponge counts were correct. DD:   10/16/00 TD:  10/16/00 Job: [id number]

## 2011-04-21 NOTE — Discharge Summary (Signed)
NAMESHADAI, MCCLANE                         ACCOUNT NO.:  [id number]   MEDICAL RECORD NO.:  [id number]                   PATIENT TYPE:  INP   LOCATION:  4707                                 FACILITY:  [facility]   PHYSICIAN:  Alvin C. Lowell Guitar, M.D.               DATE OF BIRTH:  Apr 06, 1976   DATE OF ADMISSION:  12/31/2003  DATE OF DISCHARGE:  01/05/2004                                 DISCHARGE SUMMARY   DISCHARGE DIAGNOSES:  1. Uremia secondary to end-stage renal disease.  2. Mild pulmonary edema with bilateral pleural effusions.  3. Cardiomegaly.  4. Escherichia coli urinary tract infection.  5. Anemia.  6. Malnutrition.  7. Elevated liver function tests.   PROCEDURES:  1. Hemodialysis.  2. A 2-D echo, January 04, 2004, Dr. Meade Maw.     A. Left ventricular dilated.  Overall left ventricular systolic function        markedly decreased, ejection fraction 20-30% with severe, diffuse left        ventricular hypokinesis.     B. Mild mitral valvular regurgitation.     C. Left atrium mildly dilated.     D. Right ventricular systolic function moderately reduced.     E. Inferior vena cava moderately dilated.     F. Small, free-flowing, echo-free pericardial effusion circumferential to        the heart.   HISTORY OF PRESENTING ILLNESS:  Hareem Surowiec is a 35 year old woman who  had previously been on dialysis, and had had none since December of 2003.  She was admitted to [facility] in October 2004, and seen by Dr.  Arrie Aran at that time.  She did not want to start dialysis.  She presented  to the emergency room with body aches and clinical findings consistent with  uremia, serum creatinine 8.6, potassium 4.2, hemoglobin 8.2, calcium 5.1, pH  7.238, albumin 2.4, blood pressure 126/96.  In the emergency room, Dr.  Lowell Guitar had a discussion with the patient's husband about her needing to  start hemodialysis again.  She refused to do so, despite Dr. Roanna Banning  informing her  that she would die without it.  She was admitted for  psychiatric evaluation and possibly restarting dialysis.   HOSPITAL COURSE:  The patient was admitted to the renal unit.  She had a  fever of 103.4 with mild increase in white count, 94% neutrophils, 2%  lymphocytes.  Blood cultures were done and showed no growth.  Initial urine  culture on January 02, 2004, showed 50,000 colonies, multiple species.  This  was recultured on January 04, 2004, and ultimately grew greater than 100,000  colonies of E. coli, with sensitivities not back until after she had been  discharged.  She had been empirically placed on Bactrim during her  hospitalization for possible urinary tract infection and upper respiratory  tract infection.  She also had a TB test placed during her hospitalization,  due to failure of fever to resolve.  The kidney center is to follow up on TB  test after discharge.  A 2-D echocardiogram was done due to persistent  fevers, and also due to cardiomegaly on chest x-ray.  The 2-D echocardiogram  report was not available at the time of discharge.  The physician's  assistant received verbal report from Dr. Fraser Din, who wondered if possibly  the patient had a viral pericarditis, although she did not have chest  symptoms.  Arrangements were made for her to follow up with Dr. Fraser Din at  her office after discharge.  Dr. Jeanie Sewer saw the patient for psychiatric  evaluation.  He felt she had phase of life problems, and the patient did  agree to start dialysis.  She received serial dialysis treatments throughout  her hospitalization, and tolerated these fairly well.  Hemoglobin continued  to drop during her hospitalization.  She was transfused 2 units of packed  red cells for a hemoglobin of 6.4, and during her dialysis treatment, the  outpatient kidney center will monitor her hemoglobin levels.   At the time of discharge, her dialysis schedule was changed to Tuesday,  Thursday, Saturday at  6:30 a.m. at the [facility] on [facility].   CONDITION ON DISCHARGE:  Improved.  The patient was discharged to home in  the care of her husband.   Patient's discharge medicine sheet and dialysis orders are not in the chart  at the time of this dictation.  However, these were faxed to the [facility].  She is on Epogen 20,000 units IV q hemodialysis, calcitriol  0.5 mcg IV q hemodialysis, Tums Ultra one with meals three times a day, and  nephro vitamin tablet per day.      Weston Settle, P.A.                      Mindi Slicker. Lowell Guitar, M.D.    MB/MEDQ  D:  01/28/2004  T:  01/29/2004  Job:  [id number]   cc:   [facility]  [address]

## 2011-04-21 NOTE — Op Note (Signed)
[facility]  Patient:    Sarah Dyer, Sarah Dyer                      MRN: [id number] Proc. Date: 08/10/00 Adm. Date:  [id number] Attending:  Michaelle Copas Dictator:   Jamey Reas, M.D.                           Operative Report  DATE OF BIRTH:  [birth year]  PREOPERATIVE DIAGNOSIS:       End-stage renal failure on hemodialysis at 28 weeks, late decelerations that are repetitive while on hemodialysis.  POSTOPERATIVE DIAGNOSIS:      End-stage renal failure on hemodialysis at 28 weeks, late decelerations that are repetitive while on hemodialysis.  PROCEDURE:                    Primary low transverse cesarean section via Pfannenstiel.  SURGEON:                      Roseanna Rainbow, M.D.  ASSISTANT:                    Bing Neighbors. Clearance Coots, M.D., Jamey Reas, M.D.  ANESTHESIA:                   Spinal.  COMPLICATIONS:                None.  ESTIMATED BLOOD LOSS:         800 cc.  FLUIDS:                       Normal saline 50 cc.  URINE OUTPUT:                 Clear urine 150 cc at the end of the procedure.  INDICATIONS:                  A 35 year old G2, P1-0-0-1 at 50 and 1 today who had repetitive late decelerations earlier today while on hemodialysis.  FINDINGS:                     Female infant in cephalic presentation.  Clear fluid.  NICU present at delivery.  No known Apgars and weight at the current time.  Normal uterus, tubes, and ovaries otherwise.  PROCEDURE:                    Patient was taken to the operating room where spinal anesthesia was introduced without complication.  She was then prepared and draped in a normal sterile fashion in the dorsal supine position with a leftward tilt.  A Pfannenstiel skin incision was then made with the scalpel and carried through to the underlying layer of fascia with the Bovie.  The fascia was incised in the midline and the incision extended laterally with Mayo  scissors.  The superior aspect of the fascial incision was then grasped with Kocher clamps, elevated, and the underlying rectus muscles dissected off bluntly.  Attention was then turned to the inferior aspect of this incision which in a similar fashion was grasped, tented up with Kocher cramps, and the rectus muscle dissected off bluntly.  The rectus muscles were then separated in the midline and the peritoneum identified, tented up, and entered sharply with Metzenbaum scissors.  The peritoneal incision was then  extended superiorly and inferiorly with good visualization of the bladder.  The bladder blade was then inserted and the vesicouterine peritoneum identified, grasped with pickups, and entered sharply with Metzenbaum scissors.  The incision was then extended laterally and the bladder flap created digitally.  The bladder blade was then reinserted and the lower uterine segment incised in a transverse fashion with the scalpel.  The uterine incision was then extended laterally with bandage scissors.  The bladder blade was removed and the infants head delivered atraumatically.  The nose and mouth were suctioned with bulb suction at the time of delivery.  The cord clamped and cut.  The infant was handed off to the waiting NICU team.  Cord gasses were sent.  The placenta was then removed manually, the uterus exteriorized and cleared of all clots and debris.  The uterine incision was repaired with 0 Monocryl suture in a running locked fashion.  The bladder flap was ______.  The uterine incision was found to be hemostatic.  The gutters were irrigated with warm saline and cleared of all clots.  The rectus muscles were reapproximated with 0 Monocryl suture and one interrupted suture.  The fascia was reapproximated with 0 Vicryl in a running fashion.  The skin was closed with staples.  Patient tolerated the procedure well.  Sponge, lap, and needle counts were correct x 2.  Cefotetan 1 g was given  at cord clamp.  The patient was taken to the recovery room in stable condition.  DD:  08/10/00 TD:  08/12/00 Job: [id number]

## 2011-04-21 NOTE — Discharge Summary (Signed)
[facility]  Patient:    Sarah Dyer, Sarah Dyer                      MRN: [id number] Adm. Date:  [id number] Disc. Date: [id number] Attending:  Willow Ora Dictator:   Jamey Reas, M.D.                           Discharge Summary  DATE OF BIRTH:                Mar 14, 1976.  ADMISSION DIAGNOSES:          1. Chronic renal failure.                               2. Chronic hypertension.                               3. Renovascular hypertension.                               4. Intrauterine pregnancy.  DISCHARGE DIAGNOSES:          1. Chronic renal failure.                               2. Chronic hypertension.                               3. Renovascular hypertension.                               4. Intrauterine pregnancy.                               5. Mild preeclampsia.                               6. Late decelerations.                               7. Delivery of viable female prematurely.  PROCEDURES:                   1. Low transverse cesarean section, 08/10/00.                               2. Hemodialysis, 08/08/00.  CONSULTATIONS:                [facility].  HOSPITAL COURSE:              A 35 year old Hispanic female admitted on August 07, 2000, admitted for further evaluation on August 07, 2000.  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 She is on chronic hemodialysis six days a week for four hours at the [facility].  The patient was admitted, continued on her oral prednisone which she took for her kidney disease.  She did receive betamethasone 12.5 mg  x 2 on two successive days 24 hours apart for improvement of fetal lung maturity.  She was maintained on hemodialysis through the first several days of her hospitalization.  The patient was noted to have decreased diastolic flow and IUGR for fetus by ultrasound.  Her fetal heart rate tracing was  suspicious for late decelerations.  She was noted to have repetitive late decelerations on August 10, 2000.  She was taken to the operating room for a primary low transverse cesarean section for developing late decelerations while on hemodialysis.  The patient tolerated the procedure well without complications during pregnancy.  She had 150 cc of clear urine output.  She received minimal fluids, 50 cc of normal saline.  She had an estimated blood loss of approximately 800 cc.  Infant went to NICU for further evaluation.  Mother remained stable throughout the remainder of her hospital course.  DISCHARGE DISPOSITION:        She was discharged on postoperative day #3.  She remained afebrile throughout.  She remained hemodynamically stable throughout as well.  The patient was discharged to home on August 13, 2000, to follow up for hemodialysis at [facility] on Tuesday, August 14, 2000, at 11:45 a.m.  DISCHARGE MEDICATIONS:        She was discharged on her usual renal medications.  The remainder of her medications were to be managed by her kidney doctors. DD:  09/12/00 TD:  09/13/00 Job: [id number]

## 2011-04-21 NOTE — Discharge Summary (Signed)
Sarah Dyer, Sarah Dyer                         ACCOUNT NO.:  [id number]   MEDICAL RECORD NO.:  [id number]                   PATIENT TYPE:  INP   LOCATION:  5528                                 FACILITY:  [facility]   PHYSICIAN:  Santiago Bumpers. Hensel, M.D.             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   PRIMARY MD:  Urgent medical center.   DISCHARGE DIAGNOSES:  1. Chronic renal failure.  2. Anemia.  3. Uremia.   DISCHARGE MEDICATIONS:  1. Calcium carbonate 250 mg p.o. 2 tablets 3 times a day with meals.  2. Atenolol 25 mg p.o. daily.  3. Iron 325 mg p.o. daily.  4. Aranesp 60 mg subcutaneous every week.   CONSULTATIONS:  1. Renal--Dr. Arrie Aran.  2. Nutrition.   PROCEDURES:  1. Chest x-ray.  2. EKG.  3. A transfusion of 2 units of packed red blood cells.   HISTORY:  The patient is a 35 year old female with a history of chronic  renal failure who had a 14-day history of dizziness and a 33-month history of  loss of appetite and intermittent vomiting.  She has lost about 10 pounds  since her vomiting has started.  She described how she has had a large  decrease in her appetite for about 2 months which coincides with a  separation with her husband  She had recently been in [place] and has just  arrived to the [place] about 2 weeks prior to admission.  She still  has her menstrual cycles and she has an IUD in place.  She has had no fever,  and no abdominal pain.  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  She  received dialysis for about 1 year here in the [place] and then went  to [place] and got dialysis for about 1 week. She then stopped dialysis in  [place] and decided to treat her self naturally with herbs.   The patient has not received dialysis for about a year and nine months and  she says that she has felt fine since she stopped dialysis with the  exception of the recent dizziness and loss of appetite.  The patient was  seen at the urgent medical care center and with her history of anemia she  was told that she would be able to receive a blood transfusion at [facility].   REVIEW OF SYSTEMS:  Pertinent review of systems includes dizziness,  palpitations, shortness  of breath, and weight loss.   PHYSICAL EXAMINATION:  VITAL SIGNS:  Temperature 96.7, pulse of 91, blood  pressure 147/99, respiratory rate 20.  GENERAL:  No acute distress. Very thin appearing.  CARDIOVASCULAR:  S1-S2 and a possible S3.  LUNGS:  Clear to auscultation.  ABDOMEN:  Abdomen was tender at the right upper quadrant to deep palpation  and her liver was palpated about 2-3 cm below the ribs.  EXTREMITIES:  There is a fistula present on the left arm with thrill and  bruit.  NEUROLOGIC:  No asterixis.   ADMISSION LABORATORY DATA:  A CBC with white blood cell count of 5.7,  hemoglobin of 7.7, hematocrit 22.5, platelets 231, MCV of 84.6, neutrophils  72%, lymphocytes 16%.  Admission ISAT: Sodium 142, potassium 4.5, chloride  108, bicarb 18, BUN 145, creatinine 14.1, glucose 119, calcium 7.9, total  bilirubin  0.5, AST 17, ALT 23, alkaline phosphatase 87, total protein 6.2,  albumin 3.3.  Amylase was 169; lipase was 41.  PT 13.4, INR 1.4, PTT was 32.  Urinalysis was clear with a specific gravity of 1.015, pH of 5.5,  significant for large blood and protein greater than 300, negative glucose,  negative ketones, negative nitrite.  Urine microscopic showed: Rare  epithelial cells, white blood cells 0-2, red blood  cells 11-20, and bacteria  none.  Ferritin level was 73, iron level was 40; total iron-binding capacity  was 216; and percent saturation of iron was 19. A urine pregnancy test was  negative.  Chest x-ray showed cardiomegaly with pulmonary edema.  EKG showed  a sinus tachycardia, left atrial enlargement and left ventricular  hypertrophy.   HOSPITAL COURSE:  The patient was admitted for chronic renal failure, uremia  and anemia.  Please see history and physical in the chart for additional  details.   PROBLEM LIST:  Problem 1:  CHRONIC RENAL FAILURE.  The patient was found to  have a BUN of 145 and a creatinine of 14.1 and with her significant history  of chronic renal failure it was advised to the patient that dialysis would  be the best course of treatment.  We told the patient that since she has not  continued dialysis treatment we were concerned that her kidney disease has  continued and that is what is causing her to feel ill with dizziness,  nausea, vomiting, and loss of appetite.  The patient stated that she  absolutely did not want to receive hemodialysis any more including this  hospitalization.  She was very adamant about this decision.  Renal was  consulted and Dr. Arrie Aran came to see the patient.  He explained to the  patient that  hemodialysis was necessary and would make her feel better.  She again refused dialysis.  The consequences of not receiving hemodialysis  were explained to the patient.  The patient had full understanding that she  could potentially become more ill and could possibly die if she did not  receive dialysis.  She again refused dialysis even after stating the  potential consequences.  She stated that she will continue to treat herself  naturally; and absolutely did not want dialysis.  The patient was told that  she would be welcome to be seen in the nephrology clinic and whenever she wanted to come and also if ever she decided to change her mind about   dialysis.   The patient described that she had been having difficulty with her husband  and they were possibly going  to be separated. It seemed that the patient  could possibly be depressed at this moment in time.  It was discussed if her  recent depression would be affecting her judgment in her decision to refuse  dialysis and it was decided that decision to refuse dialysis was  longstanding before her recent separation with her husband.  It was decided  that her current decision to refuse dialysis was consistent with her belief  to treat herself naturally which she has been doing for the last year and 9  months.  It was decided that because her decision to refuse dialysis had  been ongoing before the recent onset of depression that she is capable of  making her own decisions concerning her health care.  She was deemed  competent to refuse care.   Problem 2:  UREMIA.  The patient has symptoms of uremia including nausea,  vomiting, loss of appetite, etc.  She did not have asterixis on examination.  Again, dialysis would be the choice of treatment for uremia, but the patient  has refused hemodialysis.   Problem 3:  ANEMIA.  The patient was found to be anemic with a hemoglobin of  7.7 on admission.  Her hemoglobin dropped to 6.9 the day after admission.  The MCV was found to be 84.6 which is within normal limits.  The patient's  ferritin was 753, therefore, the patient's anemia seems to be consistent  with an anemia of chronic disease instead of an iron-deficiency anemia.  Because of the patient's anemia and the patient's desire for a transfusion  her blood was typed and screened and 2 units of packed red blood cells were  transfused; 80 mg of Lasix IV was given between the 2 units of packed red  blood cells.  The patient did not experience any signs or symptoms of volume  overload.  The patient tolerated the transfusion well.   Per renal consult suggestions:  The patient was set up to  receive Aranesp 60  mg subcutaneous every week at the Short Stay clinic at [facility].  Her first injection was scheduled for November 2 at 11 a.m.  The patient was  also sent home to continue her daily iron supplementation.   Problem 4:  HYPOCALCEMIA AND HYPERPHOSPHATEMIA. The patient was found to  have a low calcium level and a high phosphorus level with his consistent  with a chronic renal failure.  The patient was sent home with instructions  to take calcium carbonate or TUMS 2 tablets with each meal 3 times a day.   Problem 5:  HYPERTENSION.  The patient had been hypertensive throughout  hospitalization.  This was thought to the be due to moderate volume overload  consistent with chronic renal failure.  To further assess the effects of the  hypertension an EKG was ordered and that showed left atrial enlargement and  left ventricular hypertrophy.  The left ventricular hypertrophy is very consistent with hypertension.  The patient was started on atenolol 25 mg  daily for treatment of her hypertension and this will be followed in clinic  as an outpatient.   DISPOSITION:  The patient was discharged in fair condition.   DISCHARGE INSTRUCTIONS:  1. Activity:  No restrictions.  2. Diet included a renal diet which was instructed by nutrition.  3. Follow up was set up in the [facility] at the [facility] on November 8 and also Aranesp injections at the Short  Stay Clinic at [facility].      Royanne Foots ([id number])                     Santiago Bumpers. Leveda Anna, M.D.    Hadley Pen  D:  10/04/2003  T:  10/04/2003  Job:  [id number]

## 2011-04-21 NOTE — Consult Note (Signed)
[name], [name]                         ACCOUNT NO.:  [id number]   MEDICAL RECORD NO.:  [id number]                   PATIENT TYPE:  INP   LOCATION:  5528                                 FACILITY:  [facility]   PHYSICIAN:  Terrial Rhodes, M.D.             DATE OF BIRTH:  08-Oct-1976   DATE OF CONSULTATION:  10/01/2003  DATE OF DISCHARGE:                                   CONSULTATION   CONSULTING PHYSICIAN:  Santiago Bumpers. Leveda Anna, M.D.   REASON FOR REFERRAL:  Renal failure.   HISTORY OF PRESENT ILLNESS:  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  She subsequently left the [place] and went to  [place] and has not had dialysis for the last 21 months.  She now presents to  the emergency room complaining of dizziness, vomiting, nausea, malaise.  The  patient has had a loss of appetite, has not been able to eat, and she has  lost approximately 10 pounds over the last several weeks.  She presented  with these complaints and workup had revealed a hemoglobin of 7.7 and a BUN  and creatinine of 145 and 14.1 respectively.  We have been asked to talk  with her regarding hemodialysis again.  Of note, the patient went to [place]  and received dialysis for a week but then stopped it, and took herbs from a  medicine woman there and has not had any dialysis since that time.   ALLERGIES:  No known drug allergies.   PAST MEDICAL HISTORY:  1. 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  2. Anemia.  3. History of IUD placement.   CURRENT MEDICATIONS:  Iron.   FAMILY HISTORY:  Father is 81 with  diabetes.  Mother is 78.  She has five  brothers, three sisters.  No family history of kidney disease, heart  disease, cancer.   SOCIAL HISTORY:  She is from [place] but currently lives in [place] with  her husband and two daughters age 50 and three.  No tobacco, no alcohol.  She came back to the [place] approximately 15 days ago.   REVIEW OF SYSTEMS:  Per interpreter:  GENERAL:  She has had dizziness,  malaise, weight loss, fatigue.  OPHTHALMIC:  No blurred vision, photophobia.  ENT:  No tinnitus, dysphagia, odynophagia.  CARDIAC:  No chest pain,  palpitations, orthopnea, PND.  PULMONARY:  No hemoptysis, productive cough,  shortness of breath.  GI:  Has some nausea, anorexia, decreased appetite.  No hematochezia, melena, or bright red blood per rectum.  GU:  No dysuria,  pyuria, hematuria.  HEMATOLOGIC:  No abnormal bleeding or bruising.  DERMATOLOGIC:  No rashes, lumps, or bumps.  RHEUMATOLOGIC:  No swollen,  painful, erythematous joints.  NEUROLOGIC:  No numbness, tingling, or  weakness but does have generalized fatigue and weakness.  All other systems  negative.   PHYSICAL EXAMINATION:  GENERAL:  This is a thin female in no apparent  distress.  VITAL SIGNS:  Temperature 98, pulse 85, blood pressure 141/65, respiratory  rate 20.  HEENT:  Head normocephalic, atraumatic.  Pupils equal, round, and reactive  to light.  Extraocular muscles intact.  No icterus.  Oropharynx without  lesions.  NECK:  Supple.  LUNGS:  Clear to auscultation and percussion bilaterally.  No rales, rubs,  or rhonchi.  CARDIAC:  Tachycardic at 105.  No precordial rub appreciated.  ABDOMEN:  Benign.  EXTREMITIES:  No edema.   LABORATORY DATA:  White blood cell count 5.3, hemoglobin 6.9, platelets 223.  Sodium 139, potassium 4.3, chloride 100, CO2 17, BUN 146, creatinine 13.7,  glucose 85, calcium 7.5, albumin 3, phosphorus 8.5.  Ferritin is 753, iron  is 40, percent saturation is 19%.  A urinalysis  showed 3+ protein, large  blood.   ASSESSMENT AND PLAN:  1. 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  The patient is now markedly elevated BUN and     creatinine.  Discussed her prognosis through an interpreter.  However,     the patient is refusing hemodialysis.  She says that she has been doing     fine for the last 21 months and does not want to go back on dialysis     because she did not feel well when she was on dialysis.  She reports she     feels better off of dialysis, understands that she could die from her     kidney failure, but does not want hemodialysis at this time.  She still     has an A-V fistula in her left upper arm and wanted to have this taken     out.  However, we discussed that she may need to have dialysis in the     near future and although we could not force her to do dialysis I would     leave that in because in all likelihood she will require it in the near     future.  She understands the consequences of her decision and currently     there is no emergent indication for dialysis although clinically she is     uremic symptoms, does not have a pericardial rub, does not have any     asterixis, and is currently oriented and competent to make decisions on     her own.  We have discussed this with the primary service and will be     available if she changes her mind.  We would also prefer her to follow up     as an outpatient with [place] but, again, will defer to the primary service as     she is currently not amenable to this.  2. Anemia secondary to #1.  The patient would benefit from blood transfusion     and would also start her on Aranesp 60 to 100 mcg subcu each week and     continue to follow her CBC as an outpatient.  3. Health care maintenance.  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  Would recommend to    check a calcium, phosphorus, and then check PTH although it is not  clear     that she would take any of these medications as an outpatient and     therefore will wait for her to follow up in our clinic before we would     proceed with any of this.   Will be available for any other questions or concerns and dialysis if she  changes her mind.  Thank you for this consultation.                                               Terrial Rhodes, M.D.    JC/MEDQ  D:  10/01/2003  T:  10/01/2003  Job:  [id number]

## 2011-04-21 NOTE — Discharge Summary (Signed)
[place]  Patient:    Sarah Dyer, Sarah Dyer                      MRN: [id number] Adm. Date:  [id number] Disc. Date: [id number] Attending:  Sanjuana Letters Dictator:   Doren Custard, M.D.                           Discharge Summary  ADDENDUM  Patient is stable and asymptomatic on the night of June 7. Additional results include a urine culture which revealed no growth.  HIV and hepatitis B and C serologies were drawn on May 10, 2000, and were pending at the time of transfers.  Labs on May 11, 2000, show a sodium of 134, potassium 4.8, chloride 108, bicarbonate 20, BUN 74, creatinine 5.1, glucose 78, calcium 8.4, magnesium 2.7, phosphorus 6.1.  PLAN: 1. Transfer to [facility] to the [facility] service (Dr. Ruthe Mannan as receiving physician).    Spoke with Dr. ______ (GYN attending) and OB-GYN service will evaluate    situation in case they proceed with further workup and management.  Family    is aware of this plan. 2. Nephrology at [facility] is aware of the patient and will consult on the patient    for the time being. 3. Ampicillin discontinued secondary to negative urine culture. 4. Patient is to transfer to [facility] service if bed available. DD:  05/11/00 TD:  05/15/00 Job: [id number]

## 2011-05-05 ENCOUNTER — Other Ambulatory Visit: Payer: Self-pay | Admitting: Nephrology

## 2011-05-05 ENCOUNTER — Ambulatory Visit
Admission: RE | Admit: 2011-05-05 | Discharge: 2011-05-05 | Disposition: A | Discharge status: Home / Self Care | Payer: Self-pay | Source: Ambulatory Visit | Attending: Nephrology | Admitting: Nephrology

## 2011-05-05 DIAGNOSIS — R609 Edema, unspecified: Secondary | ICD-10-CM

## 2011-05-05 DIAGNOSIS — M79676 Pain in unspecified toe(s): Secondary | ICD-10-CM

## 2011-09-12 ENCOUNTER — Emergency Department (HOSPITAL_COMMUNITY)
Admission: EM | Admit: 2011-09-12 | Discharge: 2011-09-13 | Disposition: A | Discharge status: Home / Self Care | Payer: Self-pay | Attending: Emergency Medicine | Admitting: Emergency Medicine

## 2011-09-12 ENCOUNTER — Emergency Department (HOSPITAL_COMMUNITY): Payer: Self-pay

## 2011-09-12 DIAGNOSIS — N186 End stage renal disease: Secondary | ICD-10-CM | POA: Insufficient documentation

## 2011-09-12 DIAGNOSIS — Z79899 Other long term (current) drug therapy: Secondary | ICD-10-CM | POA: Insufficient documentation

## 2011-09-12 DIAGNOSIS — R1013 Epigastric pain: Secondary | ICD-10-CM | POA: Insufficient documentation

## 2011-09-12 DIAGNOSIS — R112 Nausea with vomiting, unspecified: Secondary | ICD-10-CM | POA: Insufficient documentation

## 2011-09-12 DIAGNOSIS — K2990 Gastroduodenitis, unspecified, without bleeding: Secondary | ICD-10-CM | POA: Insufficient documentation

## 2011-09-12 DIAGNOSIS — K2970 Gastritis, unspecified, without bleeding: Secondary | ICD-10-CM | POA: Insufficient documentation

## 2011-09-12 DIAGNOSIS — M545 Low back pain, unspecified: Secondary | ICD-10-CM | POA: Insufficient documentation

## 2011-09-12 DIAGNOSIS — M533 Sacrococcygeal disorders, not elsewhere classified: Secondary | ICD-10-CM | POA: Insufficient documentation

## 2011-09-12 LAB — CBC
HCT: 39.1 % (ref 36.0–46.0)
Hemoglobin: 13.2 g/dL (ref 12.0–15.0)
MCH: 28 pg (ref 26.0–34.0)
MCHC: 33.8 g/dL (ref 30.0–36.0)
MCV: 83 fL (ref 78.0–100.0)
Platelets: 246 10*3/uL (ref 150–400)
RBC: 4.71 MIL/uL (ref 3.87–5.11)
RDW: 13.5 % (ref 11.5–15.5)
WBC: 8.8 10*3/uL (ref 4.0–10.5)

## 2011-09-12 LAB — URINALYSIS, ROUTINE W REFLEX MICROSCOPIC
Bilirubin Urine: NEGATIVE
Glucose, UA: NEGATIVE mg/dL
Hgb urine dipstick: NEGATIVE
Ketones, ur: NEGATIVE mg/dL
Leukocytes, UA: NEGATIVE
Nitrite: NEGATIVE
Protein, ur: NEGATIVE mg/dL
Specific Gravity, Urine: 1.008 (ref 1.005–1.030)
Urobilinogen, UA: 0.2 mg/dL (ref 0.0–1.0)
pH: 6 (ref 5.0–8.0)

## 2011-09-12 LAB — BASIC METABOLIC PANEL
BUN: 15 mg/dL (ref 6–23)
CO2: 26 mEq/L (ref 19–32)
Calcium: 9.7 mg/dL (ref 8.4–10.5)
Chloride: 101 mEq/L (ref 96–112)
Creatinine, Ser: 1.08 mg/dL (ref 0.50–1.10)
GFR calc Af Amer: 77 mL/min — ABNORMAL LOW (ref >90)
GFR calc non Af Amer: 67 mL/min — ABNORMAL LOW (ref >90)
Glucose, Bld: 99 mg/dL (ref 70–99)
Potassium: 4.3 mEq/L (ref 3.5–5.1)
Sodium: 136 mEq/L (ref 135–145)

## 2011-09-12 LAB — DIFFERENTIAL
Basophils Absolute: 0 10*3/uL (ref 0.0–0.1)
Basophils Relative: 0 % (ref 0–1)
Eosinophils Absolute: 0 10*3/uL (ref 0.0–0.7)
Eosinophils Relative: 0 % (ref 0–5)
Lymphocytes Relative: 21 % (ref 12–46)
Lymphs Abs: 1.9 10*3/uL (ref 0.7–4.0)
Monocytes Absolute: 0.8 10*3/uL (ref 0.1–1.0)
Monocytes Relative: 9 % (ref 3–12)
Neutro Abs: 6.2 10*3/uL (ref 1.7–7.7)
Neutrophils Relative %: 70 % (ref 43–77)

## 2011-09-12 LAB — PREGNANCY, URINE: Preg Test, Ur: NEGATIVE

## 2011-09-12 LAB — POCT I-STAT TROPONIN I: Troponin i, poc: 0 ng/mL (ref 0.00–0.08)

## 2011-09-13 LAB — HEPATIC FUNCTION PANEL
ALT: 28 U/L (ref 0–35)
AST: 20 U/L (ref 0–37)
Albumin: 3.4 g/dL — ABNORMAL LOW (ref 3.5–5.2)
Alkaline Phosphatase: 70 U/L (ref 39–117)
Bilirubin, Direct: <0.1 mg/dL (ref 0.0–0.3)
Total Bilirubin: 0.3 mg/dL (ref 0.3–1.2)
Total Protein: 6.7 g/dL (ref 6.0–8.3)

## 2011-09-13 LAB — CK TOTAL AND CKMB (NOT AT ARMC)
CK, MB: 2 ng/mL (ref 0.3–4.0)
Relative Index: INVALID (ref 0.0–2.5)
Total CK: 50 U/L (ref 7–177)

## 2011-09-13 LAB — LIPASE, BLOOD: Lipase: 29 U/L (ref 11–59)

## 2011-12-11 ENCOUNTER — Other Ambulatory Visit: Payer: Self-pay | Admitting: Nephrology

## 2011-12-11 DIAGNOSIS — Z1231 Encounter for screening mammogram for malignant neoplasm of breast: Secondary | ICD-10-CM

## 2011-12-27 ENCOUNTER — Ambulatory Visit
Admission: RE | Admit: 2011-12-27 | Discharge: 2011-12-27 | Disposition: A | Discharge status: Home / Self Care | Payer: Self-pay | Source: Ambulatory Visit | Attending: Nephrology | Admitting: Nephrology

## 2011-12-27 DIAGNOSIS — Z1231 Encounter for screening mammogram for malignant neoplasm of breast: Secondary | ICD-10-CM

## 2012-02-02 ENCOUNTER — Other Ambulatory Visit: Payer: Self-pay | Admitting: Family Medicine

## 2012-04-15 ENCOUNTER — Encounter: Payer: Self-pay | Admitting: *Deleted

## 2012-04-15 ENCOUNTER — Encounter: Payer: Self-pay | Attending: Nephrology | Admitting: *Deleted

## 2012-04-15 DIAGNOSIS — Z940 Kidney transplant status: Secondary | ICD-10-CM | POA: Insufficient documentation

## 2012-04-15 DIAGNOSIS — Z713 Dietary counseling and surveillance: Secondary | ICD-10-CM | POA: Insufficient documentation

## 2012-04-15 DIAGNOSIS — I10 Essential (primary) hypertension: Secondary | ICD-10-CM | POA: Insufficient documentation

## 2012-04-15 NOTE — Progress Notes (Signed)
  Medical Nutrition Therapy:  Appt start time: 0915 end time:  1015.  Assessment:  Primary concerns today: Patient here with Spanish interpretor for assistance with weight loss, hypertension and sodium restriction. She states she was told by her MD that she needed to lose about 10 pounds at her last visit 3 months ago and she states she has lost 6 pounds since then. She has modified her eating habits, does not eat salty foods and has increased her activity level to 30-60 minutes daily.    MEDICATIONS: see list   DIETARY INTAKE:  Usual eating pattern includes 3 meals and 0 snacks per day.  Everyday foods include good variety of most food groups.  Avoided foods include lactose, fatty beef, and high sodium foods .    24-hr recall:  B ( AM): was eating more bread, now sweetened cereal with almond milk, fresh fruit, water  Snk ( AM): none  L ( PM): small amount of meat or chicken, vegetables, home made broth, tortilla x 2, water Snk ( PM): fresh fruit  D ( PM): salad with chicken and / steamed vegetables, fresh fruit OR left overs from lunch if really hungry rarely Snk ( PM): none Beverages: water  Usual physical activity: Zumba class 3 times a week for an hour and a treadmill at home for past 3 months  Estimated energy needs: 1400 calories 158 g carbohydrates 105 g protein 39 g fat  Progress Towards Goal(s):  In progress.   Nutritional Diagnosis:  NI-1.5 Excessive energy intake As related to weight loss.  As evidenced by BMI improved from 26.4 % to 25.3 % in past 3 months .    Intervention:  Nutrition counseling provided in conjunction with her current eating habits, sodium restriction and increased activity level. Plan: Continue with current food choices which has allowed a 6 pound weight loss in past 3 months Continue with salt restriction Continue with 30 minutes to 1 hour of activity every day Doing a great job!  Monitoring/Evaluation:  Dietary intake, exercise, sodium  intake, and body weight prn.

## 2012-04-15 NOTE — Patient Instructions (Addendum)
Plan: Continue with current food choices which has allowed a 6 pound weight loss in past 3 months Continue with salt restriction Continue with 30 minutes to 1 hour of activity every day Doing a great job!

## 2012-12-27 ENCOUNTER — Other Ambulatory Visit: Payer: Self-pay | Admitting: Nephrology

## 2012-12-27 DIAGNOSIS — Z1231 Encounter for screening mammogram for malignant neoplasm of breast: Secondary | ICD-10-CM

## 2013-01-22 ENCOUNTER — Ambulatory Visit
Admission: RE | Admit: 2013-01-22 | Discharge: 2013-01-22 | Disposition: A | Discharge status: Home / Self Care | Payer: Self-pay | Source: Ambulatory Visit | Attending: Nephrology | Admitting: Nephrology

## 2013-01-22 DIAGNOSIS — Z1231 Encounter for screening mammogram for malignant neoplasm of breast: Secondary | ICD-10-CM

## 2013-07-23 ENCOUNTER — Encounter (HOSPITAL_COMMUNITY): Payer: Self-pay | Admitting: Emergency Medicine

## 2013-07-23 ENCOUNTER — Inpatient Hospital Stay (HOSPITAL_COMMUNITY)
Admission: EM | Admit: 2013-07-23 | Discharge: 2013-07-25 | DRG: 391 | Disposition: A | Discharge status: Home / Self Care | Payer: Medicaid Other | Attending: Family Medicine | Admitting: Family Medicine

## 2013-07-23 DIAGNOSIS — IMO0002 Reserved for concepts with insufficient information to code with codable children: Secondary | ICD-10-CM

## 2013-07-23 DIAGNOSIS — Z9089 Acquired absence of other organs: Secondary | ICD-10-CM

## 2013-07-23 DIAGNOSIS — D72829 Elevated white blood cell count, unspecified: Secondary | ICD-10-CM | POA: Diagnosis present

## 2013-07-23 DIAGNOSIS — N186 End stage renal disease: Secondary | ICD-10-CM | POA: Diagnosis present

## 2013-07-23 DIAGNOSIS — R112 Nausea with vomiting, unspecified: Secondary | ICD-10-CM

## 2013-07-23 DIAGNOSIS — K2970 Gastritis, unspecified, without bleeding: Principal | ICD-10-CM | POA: Diagnosis present

## 2013-07-23 DIAGNOSIS — K5289 Other specified noninfective gastroenteritis and colitis: Secondary | ICD-10-CM | POA: Diagnosis present

## 2013-07-23 DIAGNOSIS — K529 Noninfective gastroenteritis and colitis, unspecified: Secondary | ICD-10-CM

## 2013-07-23 DIAGNOSIS — E860 Dehydration: Secondary | ICD-10-CM | POA: Diagnosis present

## 2013-07-23 DIAGNOSIS — Z940 Kidney transplant status: Secondary | ICD-10-CM

## 2013-07-23 DIAGNOSIS — R109 Unspecified abdominal pain: Secondary | ICD-10-CM

## 2013-07-23 HISTORY — DX: Gastritis, unspecified, without bleeding: K29.70

## 2013-07-23 NOTE — ED Notes (Addendum)
PT. REPORTS UPPER ABDOMINAL PAIN / MID BACK PAIN WITH NAUSEA , VOMITTING ,  DIARRHEA , LOW GRADE FEVER . RESPIRATIONS UNLABORED / DENIES CHEST PAIN . VOMITTED AT TRIAGE.

## 2013-07-24 ENCOUNTER — Emergency Department (HOSPITAL_COMMUNITY): Payer: Medicaid Other

## 2013-07-24 ENCOUNTER — Encounter (HOSPITAL_COMMUNITY): Payer: Self-pay | Admitting: Radiology

## 2013-07-24 DIAGNOSIS — R112 Nausea with vomiting, unspecified: Secondary | ICD-10-CM

## 2013-07-24 DIAGNOSIS — Z940 Kidney transplant status: Secondary | ICD-10-CM

## 2013-07-24 DIAGNOSIS — R109 Unspecified abdominal pain: Secondary | ICD-10-CM

## 2013-07-24 DIAGNOSIS — K529 Noninfective gastroenteritis and colitis, unspecified: Secondary | ICD-10-CM | POA: Diagnosis present

## 2013-07-24 DIAGNOSIS — K5289 Other specified noninfective gastroenteritis and colitis: Secondary | ICD-10-CM

## 2013-07-24 LAB — CBC WITH DIFFERENTIAL/PLATELET
Basophils Absolute: 0 10*3/uL (ref 0.0–0.1)
Basophils Relative: 0 % (ref 0–1)
Eosinophils Absolute: 0 10*3/uL (ref 0.0–0.7)
HCT: 40.8 % (ref 36.0–46.0)
Hemoglobin: 14.3 g/dL (ref 12.0–15.0)
Lymphs Abs: 1.1 10*3/uL (ref 0.7–4.0)
MCH: 28.9 pg (ref 26.0–34.0)
Monocytes Relative: 6 % (ref 3–12)
Neutro Abs: 12.1 10*3/uL — ABNORMAL HIGH (ref 1.7–7.7)
Neutrophils Relative %: 87 % — ABNORMAL HIGH (ref 43–77)
RBC: 4.95 MIL/uL (ref 3.87–5.11)

## 2013-07-24 LAB — URINALYSIS, ROUTINE W REFLEX MICROSCOPIC
Glucose, UA: NEGATIVE mg/dL
Leukocytes, UA: NEGATIVE
Protein, ur: NEGATIVE mg/dL
Specific Gravity, Urine: 1.016 (ref 1.005–1.030)
pH: 5.5 (ref 5.0–8.0)

## 2013-07-24 LAB — COMPREHENSIVE METABOLIC PANEL
ALT: 16 U/L (ref 0–35)
AST: 23 U/L (ref 0–37)
Albumin: 3.7 g/dL (ref 3.5–5.2)
CO2: 23 mEq/L (ref 19–32)
Calcium: 8.8 mg/dL (ref 8.4–10.5)
Creatinine, Ser: 1 mg/dL (ref 0.50–1.10)
GFR calc non Af Amer: 72 mL/min — ABNORMAL LOW (ref >90)
Sodium: 136 mEq/L (ref 135–145)
Total Protein: 7.1 g/dL (ref 6.0–8.3)

## 2013-07-24 LAB — CLOSTRIDIUM DIFFICILE BY PCR: Toxigenic C. Difficile by PCR: NEGATIVE

## 2013-07-24 LAB — URINE MICROSCOPIC-ADD ON

## 2013-07-24 LAB — BASIC METABOLIC PANEL
CO2: 21 mEq/L (ref 19–32)
Chloride: 104 mEq/L (ref 96–112)
Creatinine, Ser: 1.09 mg/dL (ref 0.50–1.10)

## 2013-07-24 LAB — POCT PREGNANCY, URINE: Preg Test, Ur: NEGATIVE

## 2013-07-24 MED ORDER — SODIUM CHLORIDE 0.9 % IV SOLN
INTRAVENOUS | Status: DC
  Administered 2013-07-24 (×2): via INTRAVENOUS

## 2013-07-24 MED ORDER — ONDANSETRON HCL 4 MG/2ML IJ SOLN: 4.0000 mg | Freq: Four times a day (QID) | INTRAMUSCULAR | Status: DC | PRN

## 2013-07-24 MED ORDER — MYCOPHENOLATE MOFETIL 250 MG PO CAPS
500.0000 mg | ORAL_CAPSULE | Freq: Three times a day (TID) | ORAL | Status: DC
  Administered 2013-07-24 – 2013-07-25 (×4): 500 mg via ORAL
  Filled 2013-07-24 (×7): qty 2

## 2013-07-24 MED ORDER — HYDROCORTISONE SOD SUCCINATE 100 MG IJ SOLR
100.0000 mg | Freq: Once | INTRAMUSCULAR | Status: AC
  Administered 2013-07-24: 100 mg via INTRAVENOUS
  Filled 2013-07-24: qty 2

## 2013-07-24 MED ORDER — FENTANYL CITRATE 0.05 MG/ML IJ SOLN
50.0000 ug | Freq: Once | INTRAMUSCULAR | Status: AC
  Administered 2013-07-24: 50 ug via INTRAVENOUS
  Filled 2013-07-24: qty 2

## 2013-07-24 MED ORDER — ONDANSETRON HCL 4 MG PO TABS: 4.0000 mg | ORAL_TABLET | Freq: Four times a day (QID) | ORAL | Status: DC | PRN

## 2013-07-24 MED ORDER — CYCLOSPORINE 100 MG/ML PO SOLN
90.0000 mg | Freq: Two times a day (BID) | ORAL | Status: DC
  Administered 2013-07-24 – 2013-07-25 (×3): 90 mg via ORAL
  Filled 2013-07-24 (×4): qty 0.9

## 2013-07-24 MED ORDER — IOHEXOL 300 MG/ML  SOLN
80.0000 mL | Freq: Once | INTRAMUSCULAR | Status: AC | PRN
  Administered 2013-07-24: 80 mL via INTRAVENOUS

## 2013-07-24 MED ORDER — KCL IN DEXTROSE-NACL 20-5-0.9 MEQ/L-%-% IV SOLN
INTRAVENOUS | Status: DC
  Filled 2013-07-24 (×2): qty 1000

## 2013-07-24 MED ORDER — PREDNISONE 5 MG PO TABS
5.0000 mg | ORAL_TABLET | Freq: Every day | ORAL | Status: DC
  Administered 2013-07-24 – 2013-07-25 (×2): 5 mg via ORAL
  Filled 2013-07-24 (×2): qty 1

## 2013-07-24 MED ORDER — ONDANSETRON HCL 4 MG/2ML IJ SOLN
4.0000 mg | Freq: Once | INTRAMUSCULAR | Status: AC
  Administered 2013-07-24: 4 mg via INTRAVENOUS
  Filled 2013-07-24: qty 2

## 2013-07-24 MED ORDER — ACETAMINOPHEN 500 MG PO TABS
500.0000 mg | ORAL_TABLET | Freq: Four times a day (QID) | ORAL | Status: DC | PRN
  Filled 2013-07-24: qty 1

## 2013-07-24 NOTE — Plan of Care (Signed)
Problem: Phase I Progression Outcomes Goal: OOB as tolerated unless otherwise ordered Outcome: Completed/Met Date Met:  07/24/13 Pt. Tolerates walking to the bathroom with x 1 assist. G

## 2013-07-24 NOTE — Progress Notes (Signed)
Pt arrived to floor in no apparent distress. Pt oriented to floor and room. Pt verbalized understanding. VSS. Baron Hamper, RN

## 2013-07-24 NOTE — Progress Notes (Signed)
Utilization Review Completed.    , RN, BSN Nurse Case Manager  [phone]  

## 2013-07-24 NOTE — Progress Notes (Signed)
Pt able to tolerate full liquid diet, advanced diet to regular per order. Tele discontinued per order.

## 2013-07-24 NOTE — ED Notes (Signed)
Pt c/o epigastric pain radiating straight through to her back, nausea, vomiting, and diarrhea starting today at 5 pm. Pt reports having 4 episodes of diarrhea in less than 1 hour, and vomiting bile. Pt denies chest pain, SOB, dizziness, or cough

## 2013-07-24 NOTE — ED Provider Notes (Signed)
CSN: [id number]     Arrival date & time 07/23/13  2333 History     First MD Initiated Contact with Patient 07/24/13 0106     Chief Complaint  Patient presents with  . Abdominal Pain   (Consider location/radiation/quality/duration/timing/severity/associated sxs/prior Treatment) HPI This is a 37 year old female status post kidney transplant for end-stage renal disease. She is here with epigastric pain that began about 5 PM yesterday afternoon. The pain is constantly present but waxes and wanes. It has become worse to the point of being severe. It is worse with movement or palpation. It is a sharp pain and radiates into the back and to the abdomen generally. Since about 8 PM she has had nausea, vomiting and diarrhea. The vomiting has been bilious. She is not aware of blood in the diarrhea. She is not aware if she has had a fever.  Past Medical History  Diagnosis Date  . Chronic kidney disease   . Gastritis    Past Surgical History  Procedure Laterality Date  . Kidney transplant    . Cholecystectomy    . Tubal ligation     No family history on file. History  Substance Use Topics  . Smoking status: Never Smoker   . Smokeless tobacco: Never Used  . Alcohol Use: No   OB History   Grav Para Term Preterm Abortions TAB SAB Ect Mult Living                 Review of Systems  All other systems reviewed and are negative.    Allergies  Review of patient's allergies indicates no known allergies.  Home Medications   Current Outpatient Rx  Name  Route  Sig  Dispense  Refill  . acetaminophen (TYLENOL) 500 MG tablet   Oral   Take 500 mg by mouth every 6 (six) hours as needed for pain.         . cycloSPORINE (SANDIMMUNE) 100 MG/ML solution   Oral   Take 90 mg by mouth 2 (two) times daily.         . fish oil-omega-3 fatty acids 1000 MG capsule   Oral   Take 1,000 mg by mouth 2 (two) times daily before a meal.         . mycophenolate (CELLCEPT) 500 MG tablet   Oral    Take 500 mg by mouth 3 (three) times daily.         Marland Kitchen omeprazole (PRILOSEC) 20 MG capsule   Oral   Take 20 mg by mouth 2 (two) times daily.         . predniSONE (DELTASONE) 5 MG tablet   Oral   Take 5 mg by mouth daily.          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  Summary of this visit's results, reviewed by myself:  Labs:  Results for orders placed during the hospital encounter of 07/23/13 (from the past 24 hour(s))  CBC WITH DIFFERENTIAL     Status: Abnormal   Collection Time    07/23/13 11:45 PM      Result Value Range   WBC 14.0 (*) 4.0 - 10.5 K/uL   RBC 4.95  3.87 - 5.11 MIL/uL   Hemoglobin 14.3  12.0 - 15.0 g/dL   HCT 21.3  08.6 - 57.8 %   MCV 82.4  78.0 - 100.0 fL   MCH 28.9  26.0 - 34.0 pg   MCHC 35.0  30.0 - 36.0 g/dL   RDW 46.9  62.9 - 52.8 %   Platelets 305  150 - 400 K/uL   Neutrophils Relative % 87 (*) 43 - 77 %   Neutro Abs 12.1 (*) 1.7 - 7.7 K/uL   Lymphocytes Relative 8 (*) 12 - 46 %   Lymphs Abs 1.1  0.7 -  4.0 K/uL   Monocytes Relative 6  3 - 12 %   Monocytes Absolute 0.8  0.1 - 1.0 K/uL   Eosinophils Relative 0  0 - 5 %   Eosinophils Absolute 0.0  0.0 - 0.7 K/uL   Basophils Relative 0  0 - 1 %   Basophils Absolute 0.0  0.0 - 0.1 K/uL  COMPREHENSIVE METABOLIC PANEL     Status: Abnormal   Collection Time    07/23/13 11:45 PM      Result Value Range   Sodium 136  135 - 145 mEq/L   Potassium 4.3  3.5 - 5.1 mEq/L   Chloride 100  96 - 112 mEq/L   CO2 23  19 - 32 mEq/L   Glucose, Bld 112 (*) 70 - 99 mg/dL   BUN 16  6 - 23 mg/dL   Creatinine, Ser 4.13  0.50 - 1.10 mg/dL   Calcium 8.8  8.4 - 24.4 mg/dL   Total Protein 7.1  6.0 - 8.3 g/dL   Albumin 3.7  3.5 - 5.2 g/dL   AST 23  0 - 37 U/L   ALT 16  0 - 35 U/L   Alkaline Phosphatase 74  39 - 117 U/L   Total Bilirubin 0.4  0.3 - 1.2 mg/dL   GFR calc non Af Amer 72 (*) >90 mL/min   GFR calc Af Amer 84 (*) >90 mL/min  LIPASE, BLOOD     Status: None   Collection Time    07/23/13 11:45 PM      Result Value Range   Lipase 20  11 - 59 U/L  POCT PREGNANCY, URINE     Status: None   Collection Time    07/24/13 12:50 AM      Result Value Range   Preg Test, Ur NEGATIVE  NEGATIVE  URINALYSIS, ROUTINE W REFLEX MICROSCOPIC     Status: Abnormal   Collection Time    07/24/13 12:59 AM      Result Value Range   Color, Urine YELLOW  YELLOW   APPearance CLEAR  CLEAR   Specific Gravity, Urine 1.016  1.005 - 1.030   pH 5.5  5.0 - 8.0   Glucose, UA NEGATIVE  NEGATIVE mg/dL   Hgb urine dipstick MODERATE (*) NEGATIVE   Bilirubin Urine NEGATIVE  NEGATIVE   Ketones,  ur NEGATIVE  NEGATIVE mg/dL   Protein, ur NEGATIVE  NEGATIVE mg/dL   Urobilinogen, UA 0.2  0.0 - 1.0 mg/dL   Nitrite NEGATIVE  NEGATIVE   Leukocytes, UA NEGATIVE  NEGATIVE  URINE MICROSCOPIC-ADD ON     Status: None   Collection Time    07/24/13 12:59 AM      Result Value Range   Squamous Epithelial / LPF RARE  RARE   RBC / HPF 3-6  <3 RBC/hpf   Bacteria, UA RARE  RARE    Imaging  Studies: Ct Abdomen Pelvis W Contrast  07/24/2013   *RADIOLOGY REPORT*  Clinical Data: Epigastric and left upper quadrant pain radiating to the back.  Nausea, vomiting, and diarrhea starting at 05:00 p.m. today.  History of kidney transplant.  CT ABDOMEN AND PELVIS WITH CONTRAST  Technique:  Multidetector CT imaging of the abdomen and pelvis was performed following the standard protocol during bolus administration of intravenous contrast.  Contrast: 80mL OMNIPAQUE IOHEXOL 300 MG/ML  SOLN  Comparison: 09/12/2011  Findings: Small focal area of atelectasis or infiltration in the left lung base.  Mild diffuse fatty infiltration of the liver.  Surgical absence of the gallbladder.  Local bile duct dilatation and the posterior segment right lobe, stable since previous study.  No enhancing mass lesion is appreciated.  The pancreas, spleen, adrenal glands, abdominal aorta, and inferior vena cava are unremarkable.  No retroperitoneal lymphadenopathy.  Marked atrophy of the native kidneys.  The stomach is normal.  Fluid-filled small bowel with suggestion of diffuse small bowel wall thickening.  This could represent small bowel edema or enteritis.  No free air or free fluid in the abdomen.  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  No hydronephrosis.  The uterus and ovaries are not enlarged.  Fluid throughout the colon consistent with history of diarrhea.  No definite colonic wall thickening.  No free or loculated pelvic fluid collections.  Appendix is not identified. Normal alignment of the lumbar vertebrae.  No destructive bone lesions appreciated.  IMPRESSION: Fluid-filled thick walled small bowel without distension suggesting bowel edema or enteritis.  Fluid filled colon consistent with history of diarrhea.  Atrophy of the native kidneys.  Normal appearing right pelvic transplant kidney.   Original Report Authenticated By: Burman Nieves, M.D.   4:13 AM Patient denies pain  at this time after receiving one dose of IV fentanyl. Abdomen is soft and nontender. In light of the patient's immunocompromise state we will have her admitted for observation.   Hanley Seamen, MD 07/24/13 [phone]

## 2013-07-24 NOTE — H&P (Signed)
[facility] History and Physical  Sarah Dyer ZOX:[id number] DOB: 1976-06-15    PCP:   Trevor Iha, MD   Chief Complaint: abdominal pain, nausea, vomting and diarrhea.  HPI: Sarah Dyer is an 37 y.o. female with hx of ESRD, previously on HD, s/p renal transplant on immunosuppressive therapy, hx of gastritis, presents to the ER with abdominal pain, diarrhea, nausea and vomiting.  She has low grade fever.  She denied black or bloody stool, ill contacts, distant travel, or change in food habits.  Evalaution in the ER included a leukocytosis with WBC of 14K (on prednisone), normal renal fx tests and normal electrolytes.  Her CT of the abd/pelvis showed evidence of small bowel edema c/w enteritis.  Hospitalist was asked to admit her because of nausea, vomiting, dehydration in the setting of immunosuppressive therapy after renal transplant.  Rewiew of Systems:  Constitutional: Negative for malaise, fever and chills. No significant weight loss or weight gain Eyes: Negative for eye pain, redness and discharge, diplopia, visual changes, or flashes of light. ENMT: Negative for ear pain, hoarseness, nasal congestion, sinus pressure and sore throat. No headaches; tinnitus, drooling, or problem swallowing. Cardiovascular: Negative for chest pain, palpitations, diaphoresis, dyspnea and peripheral edema. ; No orthopnea, PND Respiratory: Negative for cough, hemoptysis, wheezing and stridor. No pleuritic chestpain. Gastrointestinal: Negative melena, blood in stool, hematemesis, jaundice and rectal bleeding.    Genitourinary: Negative for frequency, dysuria, incontinence,flank pain and hematuria; Musculoskeletal: Negative for back pain and neck pain. Negative for swelling and trauma.;  Skin: . Negative for pruritus, rash, abrasions, bruising and skin lesion.; ulcerations Neuro: Negative for headache, lightheadedness and neck stiffness. Negative for weakness, altered level of  consciousness , altered mental status, extremity weakness, burning feet, involuntary movement, seizure and syncope.  Psych: negative for anxiety, depression, insomnia, tearfulness, panic attacks, hallucinations, paranoia, suicidal or homicidal ideation    Past Medical History  Diagnosis Date  . Chronic kidney disease   . Gastritis     Past Surgical History  Procedure Laterality Date  . Kidney transplant    . Cholecystectomy    . Tubal ligation      Medications:  HOME MEDS: Prior to Admission medications   Medication Sig Start Date End Date Taking? Authorizing Provider  acetaminophen (TYLENOL) 500 MG tablet Take 500 mg by mouth every 6 (six) hours as needed for pain.   Yes Historical Provider, MD  cycloSPORINE (SANDIMMUNE) 100 MG/ML solution Take 90 mg by mouth 2 (two) times daily.   Yes Historical Provider, MD  fish oil-omega-3 fatty acids 1000 MG capsule Take 1,000 mg by mouth 2 (two) times daily before a meal.   Yes Historical Provider, MD  mycophenolate (CELLCEPT) 500 MG tablet Take 500 mg by mouth 3 (three) times daily.   Yes Historical Provider, MD  omeprazole (PRILOSEC) 20 MG capsule Take 20 mg by mouth 2 (two) times daily.   Yes Historical Provider, MD  predniSONE (DELTASONE) 5 MG tablet Take 5 mg by mouth daily.   Yes Historical Provider, MD     Allergies:  No Known Allergies  Social History:   reports that she has never smoked. She has never used smokeless tobacco. She reports that she does not drink alcohol. Her drug history is not on file.  Family History: No family history on file.   Physical Exam: Filed Vitals:   07/24/13 0100 07/24/13 0130 07/24/13 0200 07/24/13 0531  BP: 127/68 126/68 124/63 106/57  Pulse: 92 92 90 69  Temp:  98.6 F (37 C)  TempSrc:    Oral  Resp: 19 15 25 18   SpO2: 99% 98% 96% 97%   Blood pressure 106/57, pulse 69, temperature 98.6 F (37 C), temperature source Oral, resp. rate 18, last menstrual period 07/21/2013, SpO2  97.00%.  GEN:  Pleasant  patient lying in the stretcher in no acute distress; cooperative with exam. PSYCH:  alert and oriented x4; does not appear anxious or depressed; affect is appropriate. HEENT: Mucous membranes pink and anicteric; PERRLA; EOM intact; no cervical lymphadenopathy nor thyromegaly or carotid bruit; no JVD; There were no stridor. Neck is very supple. Breasts:: Not examined CHEST WALL: No tenderness CHEST: Normal respiration, clear to auscultation bilaterally.  HEART: Regular rate and rhythm.  There are no murmur, rub, or gallops.   BACK: No kyphosis or scoliosis; no CVA tenderness ABDOMEN: soft and non-tender; no masses, no organomegaly, normal abdominal bowel sounds; no pannus; no intertriginous candida. There is no rebound and no distention. Rectal Exam: Not done EXTREMITIES: No bone or joint deformity; age-appropriate arthropathy of the hands and knees; no edema; no ulcerations.  There is no calf tenderness. AVF with thrills on left arm. Genitalia: not examined PULSES: 2+ and symmetric SKIN: Normal hydration no rash or ulceration CNS: Cranial nerves 2-12 grossly intact no focal lateralizing neurologic deficit.  Speech is fluent; uvula elevated with phonation, facial symmetry and tongue midline. DTR are normal bilaterally, cerebella exam is intact, barbinski is negative and strengths are equaled bilaterally.  No sensory loss.   Labs on Admission:  Basic Metabolic Panel:  Recent Labs Lab 07/23/13 2345  NA 136  K 4.3  CL 100  CO2 23  GLUCOSE 112*  BUN 16  CREATININE 1.00  CALCIUM 8.8   Liver Function Tests:  Recent Labs Lab 07/23/13 2345  AST 23  ALT 16  ALKPHOS 74  BILITOT 0.4  PROT 7.1  ALBUMIN 3.7    Recent Labs Lab 07/23/13 2345  LIPASE 20   No results found for this basename: AMMONIA,  in the last 168 hours CBC:  Recent Labs Lab 07/23/13 2345  WBC 14.0*  NEUTROABS 12.1*  HGB 14.3  HCT 40.8  MCV 82.4  PLT 305   Cardiac Enzymes: No  results found for this basename: CKTOTAL, CKMB, CKMBINDEX, TROPONINI,  in the last 168 hours  CBG: No results found for this basename: GLUCAP,  in the last 168 hours   Radiological Exams on Admission: Ct Abdomen Pelvis W Contrast  07/24/2013   *RADIOLOGY REPORT*  Clinical Data: Epigastric and left upper quadrant pain radiating to the back.  Nausea, vomiting, and diarrhea starting at 05:00 p.m. today.  History of kidney transplant.  CT ABDOMEN AND PELVIS WITH CONTRAST  Technique:  Multidetector CT imaging of the abdomen and pelvis was performed following the standard protocol during bolus administration of intravenous contrast.  Contrast: 80mL OMNIPAQUE IOHEXOL 300 MG/ML  SOLN  Comparison: 09/12/2011  Findings: Small focal area of atelectasis or infiltration in the left lung base.  Mild diffuse fatty infiltration of the liver.  Surgical absence of the gallbladder.  Local bile duct dilatation and the posterior segment right lobe, stable since previous study.  No enhancing mass lesion is appreciated.  The pancreas, spleen, adrenal glands, abdominal aorta, and inferior vena cava are unremarkable.  No retroperitoneal lymphadenopathy.  Marked atrophy of the native kidneys.  The stomach is normal.  Fluid-filled small bowel with suggestion of diffuse small bowel wall thickening.  This could represent small bowel edema  or enteritis.  No free air or free fluid in the abdomen.  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  No hydronephrosis.  The uterus and ovaries are not enlarged.  Fluid throughout the colon consistent with history of diarrhea.  No definite colonic wall thickening.  No free or loculated pelvic fluid collections.  Appendix is not identified. Normal alignment of the lumbar vertebrae.  No destructive bone lesions appreciated.  IMPRESSION: Fluid-filled thick walled small bowel without distension suggesting bowel edema or enteritis.  Fluid filled colon consistent  with history of diarrhea.  Atrophy of the native kidneys.  Normal appearing right pelvic transplant kidney.   Original Report Authenticated By: Burman Nieves, M.D.   Assessment/Plan Present on Admission:  . Abdominal pain . Nausea & vomiting . Gastroenteritis Renal transplant. Immunosuppressive medications.  PLAN:  Will admit her for IVF, and symptomatic treatment.  Her abdominal exam is benign.  Will send stool for C and S and C diff.  No antibiotics given.  Her meds have been continued including immunosuppressive meds.  She is stable, full code, and will be admitted to [facility] service.  Thank you for letting me partake in the care of your nice patient.  Other plans as per orders.  Code Status: FULL Unk Lightning, MD. [facility] Pager [phone] 7pm to 7am.  07/24/2013, 6:34 AM

## 2013-07-24 NOTE — Progress Notes (Signed)
Pt alert & orientedx4. Started NS@50  ml/hr per order of MD. Still having watery stools otherwise denies any pain. Good appetite. Able to tolerate ambulating to the bathroom.

## 2013-07-24 NOTE — Progress Notes (Signed)
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  Not recently on Abx, but has been exposed to nephew who has been teething for the last week and who had diarrhea.    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 Fungal Admission 08/15/00 for Chronic renal failure and Pnuemonia  Consultants:  none  Procedures:  CT ab  Antibiotics:  none   Subjective  Doing a little better.  States still having yellow stool.  No fever or chills.  No n/v/cp.  NO Sob No blurred or double vision Tol liquids but doesn't really feel hungry   Objective    Interim History: nad  Telemetry: NSR-tele d/c 8/21  Objective: Filed Vitals:   07/24/13 0130 07/24/13 0200 07/24/13 0531 07/24/13 0659  BP: 126/68 124/63 106/57 107/57  Pulse: 92 90 69 73  Temp:   98.6 F (37 C) 98.4 F (36.9 C)  TempSrc:   Oral Oral  Resp: 15 25 18 18   Height:    5\' 2"  (1.575 m)  Weight:    54.069 kg (119 lb 3.2 oz)  SpO2: 98% 96% 97% 99%   No intake or output data in the 24 hours ending 07/24/13 0846  Exam:  General: Alert pleasant.  No distress Cardiovascular: s1 s 2no m/r/g Respiratory: clear, no added sound Abdomen: soft, Nt, ND, no rebound or gaurding Skin ROM intact, no LE edema Neuro Gorssly intact  Data Reviewed: Basic Metabolic Panel:  Recent Labs Lab 07/23/13 2345  NA 136  K 4.3  CL 100  CO2 23  GLUCOSE 112*  BUN 16  CREATININE 1.00  CALCIUM 8.8   Liver Function Tests:  Recent Labs Lab 07/23/13 2345  AST 23  ALT 16  ALKPHOS 74  BILITOT  0.4  PROT 7.1  ALBUMIN 3.7    Recent Labs Lab 07/23/13 2345  LIPASE 20   No results found for this basename: AMMONIA,  in the last 168 hours CBC:  Recent Labs Lab 07/23/13 2345  WBC 14.0*  NEUTROABS 12.1*  HGB 14.3  HCT 40.8  MCV 82.4  PLT 305   Cardiac Enzymes: No results found for this basename: CKTOTAL, CKMB, CKMBINDEX, TROPONINI,  in the last 168 hours BNP: No components found with this basename: POCBNP,  CBG: No results found for this basename: GLUCAP,  in the last 168 hours  No results found for this or any previous visit (from the past 240 hour(s)).   Studies:              All Imaging reviewed and is as per above notation   Scheduled Meds: . cycloSPORINE  90 mg Oral BID  . mycophenolate  500 mg Oral TID  . predniSONE  5 mg Oral Daily   Continuous Infusions: . sodium chloride 125 mL/hr at 07/24/13 0435  . dextrose 5 % and 0.9 % NaCl with KCl 20 mEq/L       Assessment/Plan: 1. Likely viral GE.  Get Cdiff/Stool cult since on immunosuppressants.  start Fluids at 50 cc saline per hour-Bmet am + CBC-if tol diet soon can go  home 2. ESRD s/p Live transplant-Stable.  Creatinine at basleine-continue Cyclosporine 90 mg bid, Mycophenolate 500 tid, Prednisone 5 daily  3. Leukocytosis-Potentially 2/2 to infection vs steroid use.  Monitor CBC am  Code Status: Full Family Communication:  Discussed c Daughter bedside    Pleas Koch, MD  [facility] Pager [phone] 07/24/2013, 8:46 AM    LOS: 1 day

## 2013-07-25 DIAGNOSIS — R112 Nausea with vomiting, unspecified: Secondary | ICD-10-CM

## 2013-07-25 DIAGNOSIS — R9431 Abnormal electrocardiogram [ECG] [EKG]: Secondary | ICD-10-CM

## 2013-07-25 DIAGNOSIS — R109 Unspecified abdominal pain: Secondary | ICD-10-CM

## 2013-07-25 DIAGNOSIS — Z940 Kidney transplant status: Secondary | ICD-10-CM

## 2013-07-25 DIAGNOSIS — K5289 Other specified noninfective gastroenteritis and colitis: Secondary | ICD-10-CM

## 2013-07-25 LAB — BASIC METABOLIC PANEL
CO2: 22 mEq/L (ref 19–32)
Glucose, Bld: 87 mg/dL (ref 70–99)
Potassium: 4.1 mEq/L (ref 3.5–5.1)
Sodium: 136 mEq/L (ref 135–145)

## 2013-07-25 LAB — CBC
HCT: 36.3 % (ref 36.0–46.0)
Hemoglobin: 12.2 g/dL (ref 12.0–15.0)
MCH: 28.2 pg (ref 26.0–34.0)
MCV: 83.8 fL (ref 78.0–100.0)
RBC: 4.33 MIL/uL (ref 3.87–5.11)

## 2013-07-25 NOTE — Progress Notes (Signed)
Physician Discharge Summary  Sarah Dyer ZOX:[id number] DOB: 10-21-1976 DOA: 07/23/2013  PCP: Trevor Iha, MD  Admit date: 07/23/2013 Discharge date: 07/25/2013  Time spent: 18 minutes  Recommendations for Outpatient Follow-up:  1. Follow with PCP 2. Follow with Nephrologist.  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  Not recently on Abx, but has been exposed to nephew who has been teething for the last week and who had diarrhea.   Hospital Course:  1. Likely viral GE- Negative Cdiff/Stool cult since on immunosuppressants. started Fluids at 50 cc saline per hour-Bmet am + CBC normalized-tolerated diet overnight and had no further stool/N & V 2. ESRD s/p Live transplant-Stable. Creatinine at basleine-continue Cyclosporine 90 mg bid, Mycophenolate 500 tid, Prednisone 5 daily  3. Leukocytosis-Potentially 2/2 to infection vs steroid use. Monitor CBC am   Discharge Exam: Filed Vitals:   07/25/13 0507  BP: 97/58  Pulse: 61  Temp: 98.1 F (36.7 C)  Resp: 18    Alert, Pleasant oriented, NAD General: EOMI, NCAt Cardiovascular: s1 s2 no m/r/g Respiratory:  clear  Discharge Instructions  Discharge Orders   Future Orders Complete By Expires   Diet - low sodium heart healthy  As directed    Discharge instructions  As directed    Comments:     You were cared for by a hospitalist during your hospital stay. If you have any questions about your discharge medications or the care you received while you were in the hospital  after you are discharged, you can call the unit and asked to speak with the hospitalist on call if the hospitalist that took care of you is not available. Once you are discharged, your primary care physician will handle any further medical issues. Please note that NO REFILLS for any discharge medications will be authorized once you are discharged, as it is imperative that you return to your primary care physician (or establish a relationship with a primary care physician if you do not have one) for your aftercare needs so that they can reassess your need for medications and monitor your lab values. If you do not have a primary care physician, you can call [phone] for a physician referral.   Increase activity slowly  As directed        Medication List    STOP taking these medications       fish oil-omega-3 fatty acids 1000 MG capsule      TAKE these medications       acetaminophen 500 MG tablet  Commonly known as:  TYLENOL  Take 500 mg by mouth every 6 (six) hours as needed for pain.     cycloSPORINE 100 MG/ML solution  Commonly known as:  SANDIMMUNE  Take 90 mg by mouth 2 (two) times daily.     mycophenolate 500 MG tablet  Commonly known as:  CELLCEPT  Take 500 mg by mouth 3 (three) times daily.     omeprazole 20 MG capsule  Commonly known as:  PRILOSEC  Take 20 mg  by mouth 2 (two) times daily.     predniSONE 5 MG tablet  Commonly known as:  DELTASONE  Take 5 mg by mouth daily.       No Known Allergies     Follow-up Information   Follow up with DETERDING,JAMES L, MD In 1 week.   Specialty:  Nephrology   Contact information:   [address] [facility] [place] [address] [phone]        The results of significant diagnostics from this hospitalization (including imaging, microbiology, ancillary and laboratory) are listed below for reference.    Significant Diagnostic Studies: Ct Abdomen Pelvis W Contrast  07/24/2013   *RADIOLOGY REPORT*   Clinical Data: Epigastric and left upper quadrant pain radiating to the back.  Nausea, vomiting, and diarrhea starting at 05:00 p.m. today.  History of kidney transplant.  CT ABDOMEN AND PELVIS WITH CONTRAST  Technique:  Multidetector CT imaging of the abdomen and pelvis was performed following the standard protocol during bolus administration of intravenous contrast.  Contrast: 80mL OMNIPAQUE IOHEXOL 300 MG/ML  SOLN  Comparison: 09/12/2011  Findings: Small focal area of atelectasis or infiltration in the left lung base.  Mild diffuse fatty infiltration of the liver.  Surgical absence of the gallbladder.  Local bile duct dilatation and the posterior segment right lobe, stable since previous study.  No enhancing mass lesion is appreciated.  The pancreas, spleen, adrenal glands, abdominal aorta, and inferior vena cava are unremarkable.  No retroperitoneal lymphadenopathy.  Marked atrophy of the native kidneys.  The stomach is normal.  Fluid-filled small bowel with suggestion of diffuse small bowel wall thickening.  This could represent small bowel edema or enteritis.  No free air or free fluid in the abdomen.  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  No hydronephrosis.  The uterus and ovaries are not enlarged.  Fluid throughout the colon consistent with history of diarrhea.  No definite colonic wall thickening.  No free or loculated pelvic fluid collections.  Appendix is not identified. Normal alignment of the lumbar vertebrae.  No destructive bone lesions appreciated.  IMPRESSION: Fluid-filled thick walled small bowel without distension suggesting bowel edema or enteritis.  Fluid filled colon consistent with history of diarrhea.  Atrophy of the native kidneys.  Normal appearing right pelvic transplant kidney.   Original Report Authenticated By: Burman Nieves, M.D.    Microbiology: Recent Results (from the past 240 hour(s))  CLOSTRIDIUM DIFFICILE BY PCR     Status:  None   Collection Time    07/24/13  9:16 AM      Result Value Range Status   C difficile by pcr NEGATIVE  NEGATIVE Final     Labs: Basic Metabolic Panel:  Recent Labs Lab 07/23/13 2345 07/24/13 1045 07/25/13 0450  NA 136 137 136  K 4.3 4.0 4.1  CL 100 104 104  CO2 23 21 22   GLUCOSE 112* 119* 87  BUN 16 15 12   CREATININE 1.00 1.09 1.04  CALCIUM 8.8 8.4 8.5   Liver Function Tests:  Recent Labs Lab 07/23/13 2345  AST 23  ALT 16  ALKPHOS 74  BILITOT 0.4  PROT 7.1  ALBUMIN 3.7    Recent Labs Lab 07/23/13 2345  LIPASE 20   No results found for this basename: AMMONIA,  in the last 168 hours CBC:  Recent Labs Lab 07/23/13 2345 07/25/13 0450  WBC 14.0* 6.0  NEUTROABS 12.1*  --   HGB 14.3 12.2  HCT 40.8 36.3  MCV 82.4 83.8  PLT 305 255   Cardiac Enzymes: No results found for this basename: CKTOTAL, CKMB, CKMBINDEX, TROPONINI,  in the last 168 hours BNP: BNP (last 3 results) No results found for this basename: PROBNP,  in the last 8760 hours CBG: No results found for this basename: GLUCAP,  in the last 168 hours     Signed:  Rhetta Mura  [facility] 07/25/2013, 7:49 AM

## 2013-07-25 NOTE — Progress Notes (Signed)
Pt discharged to home sent down via wheelchair accompanied by Sister and transporter. No complains of pain,vomiting or nausea prior to discharge and ambulates independently. Alert and orientedx4. Discharge instructions given, pt verbalized understanding. PIV removed.

## 2013-07-25 NOTE — Progress Notes (Signed)
Pt tolerated ambulating in hall. No reports of weakness or pain.

## 2013-07-25 NOTE — Progress Notes (Signed)
Pt with no complaints of pain, nausea, or diarrhea so far this shift. Will continue to monitor. Baron Hamper, RN

## 2013-07-28 LAB — STOOL CULTURE

## 2013-07-30 ENCOUNTER — Observation Stay (HOSPITAL_COMMUNITY)
Admission: EM | Admit: 2013-07-30 | Discharge: 2013-08-01 | DRG: 392 | Disposition: A | Discharge status: Home / Self Care | Payer: Medicaid Other | Attending: Internal Medicine | Admitting: Internal Medicine

## 2013-07-30 ENCOUNTER — Encounter (HOSPITAL_COMMUNITY): Payer: Self-pay | Admitting: Emergency Medicine

## 2013-07-30 DIAGNOSIS — Z940 Kidney transplant status: Secondary | ICD-10-CM

## 2013-07-30 DIAGNOSIS — Z9851 Tubal ligation status: Secondary | ICD-10-CM

## 2013-07-30 DIAGNOSIS — R109 Unspecified abdominal pain: Secondary | ICD-10-CM

## 2013-07-30 DIAGNOSIS — R9431 Abnormal electrocardiogram [ECG] [EKG]: Secondary | ICD-10-CM

## 2013-07-30 DIAGNOSIS — R509 Fever, unspecified: Secondary | ICD-10-CM | POA: Diagnosis present

## 2013-07-30 DIAGNOSIS — K529 Noninfective gastroenteritis and colitis, unspecified: Secondary | ICD-10-CM

## 2013-07-30 DIAGNOSIS — R1013 Epigastric pain: Secondary | ICD-10-CM

## 2013-07-30 DIAGNOSIS — Z9089 Acquired absence of other organs: Secondary | ICD-10-CM

## 2013-07-30 DIAGNOSIS — R112 Nausea with vomiting, unspecified: Secondary | ICD-10-CM | POA: Diagnosis present

## 2013-07-30 DIAGNOSIS — K5289 Other specified noninfective gastroenteritis and colitis: Principal | ICD-10-CM | POA: Diagnosis present

## 2013-07-30 DIAGNOSIS — N19 Unspecified kidney failure: Secondary | ICD-10-CM

## 2013-07-30 DIAGNOSIS — D72829 Elevated white blood cell count, unspecified: Secondary | ICD-10-CM | POA: Diagnosis present

## 2013-07-30 LAB — CBC WITH DIFFERENTIAL/PLATELET
Basophils Absolute: 0 10*3/uL (ref 0.0–0.1)
Eosinophils Relative: 0 % (ref 0–5)
HCT: 40.5 % (ref 36.0–46.0)
Hemoglobin: 14.1 g/dL (ref 12.0–15.0)
Lymphocytes Relative: 11 % — ABNORMAL LOW (ref 12–46)
MCH: 28.6 pg (ref 26.0–34.0)
MCHC: 34.8 g/dL (ref 30.0–36.0)
Neutrophils Relative %: 86 % — ABNORMAL HIGH (ref 43–77)
Platelets: 297 10*3/uL (ref 150–400)
RDW: 13.1 % (ref 11.5–15.5)

## 2013-07-30 LAB — COMPREHENSIVE METABOLIC PANEL
ALT: 18 U/L (ref 0–35)
AST: 24 U/L (ref 0–37)
Alkaline Phosphatase: 73 U/L (ref 39–117)
CO2: 22 mEq/L (ref 19–32)
Calcium: 9.3 mg/dL (ref 8.4–10.5)
GFR calc non Af Amer: 68 mL/min — ABNORMAL LOW (ref >90)
Potassium: 3.9 mEq/L (ref 3.5–5.1)
Sodium: 135 mEq/L (ref 135–145)
Total Protein: 7.2 g/dL (ref 6.0–8.3)

## 2013-07-30 LAB — POCT I-STAT TROPONIN I

## 2013-07-30 MED ORDER — SODIUM CHLORIDE 0.9 % IV SOLN
1000.0000 mL | Freq: Once | INTRAVENOUS | Status: AC
  Administered 2013-07-30: 1000 mL via INTRAVENOUS

## 2013-07-30 MED ORDER — HYDROMORPHONE HCL PF 1 MG/ML IJ SOLN
1.0000 mg | Freq: Once | INTRAMUSCULAR | Status: AC
  Administered 2013-07-30: 1 mg via INTRAVENOUS
  Filled 2013-07-30: qty 1

## 2013-07-30 MED ORDER — ONDANSETRON HCL 4 MG/2ML IJ SOLN
4.0000 mg | Freq: Once | INTRAMUSCULAR | Status: AC
  Administered 2013-07-30: 4 mg via INTRAVENOUS
  Filled 2013-07-30: qty 2

## 2013-07-30 MED ORDER — PANTOPRAZOLE SODIUM 40 MG IV SOLR
40.0000 mg | Freq: Once | INTRAVENOUS | Status: AC
  Administered 2013-07-30: 40 mg via INTRAVENOUS
  Filled 2013-07-30: qty 40

## 2013-07-30 MED ORDER — SODIUM CHLORIDE 0.9 % IV SOLN
1000.0000 mL | INTRAVENOUS | Status: DC
  Administered 2013-07-30: 1000 mL via INTRAVENOUS

## 2013-07-30 NOTE — ED Notes (Signed)
PT. REPORTS EPIGASTRIC PAIN WITH NAUSEA AND VOMITTING ONSET THIS EVENING WITH SLIGHT SOB .

## 2013-07-30 NOTE — ED Provider Notes (Signed)
CSN: [id number]     Arrival date & time 07/30/13  2213 History   First MD Initiated Contact with Patient 07/30/13 2303     Chief Complaint  Patient presents with  . Abdominal Pain   (Consider location/radiation/quality/duration/timing/severity/associated sxs/prior Treatment) Patient is a 37 y.o. female presenting with abdominal pain. The history is provided by the patient.  Abdominal Pain She had onset about 5 hours ago of severe epigastric pain without radiation. There is associated nausea and vomiting. There's been no diarrhea. She denies fever, chills, sweats. Nothing makes her pain better nothing makes it worse. She's not taken any medication for it. She had been hospitalized for a similar condition last week and states that she actually feels worse today. She rates her pain at 10/10.  Past Medical History  Diagnosis Date  . Chronic kidney disease   . Gastritis    Past Surgical History  Procedure Laterality Date  . Kidney transplant  January2009  . Cholecystectomy    . Tubal ligation     No family history on file. History  Substance Use Topics  . Smoking status: Never Smoker   . Smokeless tobacco: Never Used  . Alcohol Use: No   OB History   Grav Para Term Preterm Abortions TAB SAB Ect Mult Living                 Review of Systems  Gastrointestinal: Positive for abdominal pain.  All other systems reviewed and are negative.    Allergies  Review of patient's allergies indicates no known allergies.  Home Medications   Current Outpatient Rx  Name  Route  Sig  Dispense  Refill  . acetaminophen (TYLENOL) 500 MG tablet   Oral   Take 500 mg by mouth every 6 (six) hours as needed for pain.         . cycloSPORINE (SANDIMMUNE) 100 MG/ML solution   Oral   Take 90 mg by mouth 2 (two) times daily.         . mycophenolate (CELLCEPT) 500 MG tablet   Oral   Take 500 mg by mouth 3 (three) times daily.         Marland Kitchen omeprazole (PRILOSEC) 20 MG capsule   Oral   Take 20  mg by mouth 2 (two) times daily.         . predniSONE (DELTASONE) 5 MG tablet   Oral   Take 5 mg by mouth daily.          BP 108/71  Pulse 102  Temp(Src) 97.8 F (36.6 C) (Oral)  Resp 16  SpO2 100%  LMP 07/21/2013 Physical Exam  Nursing note and vitals reviewed.  37 year old female, resting comfortably and in no acute distress. Vital signs are significant for borderline tachycardia with heart rate 102. Oxygen saturation is 100%, which is normal. Head is normocephalic and atraumatic. PERRLA, EOMI. Oropharynx is clear. Neck is nontender and supple without adenopathy or JVD. Back is nontender and there is no CVA tenderness. Lungs are clear without rales, wheezes, or rhonchi. Chest is nontender. Heart has regular rate and rhythm without murmur. Abdomen is soft, flat, with moderate epigastric tenderness. There is no rebound or guarding. There are no masses or hepatosplenomegaly and peristalsis is hypoactive. Extremities have no cyanosis or edema, full range of motion is present. Skin is warm and dry without rash. Neurologic: Mental status is normal, cranial nerves are intact, there are no motor or sensory deficits.  ED Course  Procedures (including  critical care time) Results for orders placed during the hospital encounter of 07/30/13  CBC WITH DIFFERENTIAL      Result Value Range   WBC 16.4 (*) 4.0 - 10.5 K/uL   RBC 4.93  3.87 - 5.11 MIL/uL   Hemoglobin 14.1  12.0 - 15.0 g/dL   HCT 09.8  11.9 - 14.7 %   MCV 82.2  78.0 - 100.0 fL   MCH 28.6  26.0 - 34.0 pg   MCHC 34.8  30.0 - 36.0 g/dL   RDW 82.9  56.2 - 13.0 %   Platelets 297  150 - 400 K/uL   Neutrophils Relative % 86 (*) 43 - 77 %   Neutro Abs 14.1 (*) 1.7 - 7.7 K/uL   Lymphocytes Relative 11 (*) 12 - 46 %   Lymphs Abs 1.8  0.7 - 4.0 K/uL   Monocytes Relative 3  3 - 12 %   Monocytes Absolute 0.5  0.1 - 1.0 K/uL   Eosinophils Relative 0  0 - 5 %   Eosinophils Absolute 0.0  0.0 - 0.7 K/uL   Basophils Relative 0  0 -  1 %   Basophils Absolute 0.0  0.0 - 0.1 K/uL  COMPREHENSIVE METABOLIC PANEL      Result Value Range   Sodium 135  135 - 145 mEq/L   Potassium 3.9  3.5 - 5.1 mEq/L   Chloride 100  96 - 112 mEq/L   CO2 22  19 - 32 mEq/L   Glucose, Bld 121 (*) 70 - 99 mg/dL   BUN 18  6 - 23 mg/dL   Creatinine, Ser 8.65  0.50 - 1.10 mg/dL   Calcium 9.3  8.4 - 78.4 mg/dL   Total Protein 7.2  6.0 - 8.3 g/dL   Albumin 3.7  3.5 - 5.2 g/dL   AST 24  0 - 37 U/L   ALT 18  0 - 35 U/L   Alkaline Phosphatase 73  39 - 117 U/L   Total Bilirubin 0.5  0.3 - 1.2 mg/dL   GFR calc non Af Amer 68 (*) >90 mL/min   GFR calc Af Amer 79 (*) >90 mL/min  LIPASE, BLOOD      Result Value Range   Lipase 25  11 - 59 U/L  POCT I-STAT TROPONIN I      Result Value Range   Troponin i, poc 0.03  0.00 - 0.08 ng/mL   Comment 3           CG4 I-STAT (LACTIC ACID)      Result Value Range   Lactic Acid, Venous 1.16  0.5 - 2.2 mmol/L  POCT I-STAT TROPONIN I      Result Value Range   Troponin i, poc 0.00  0.00 - 0.08 ng/mL   Comment 3           POCT PREGNANCY, URINE      Result Value Range   Preg Test, Ur NEGATIVE  NEGATIVE   Dg Chest 2 View  07/31/2013   *RADIOLOGY REPORT*  Clinical Data: Abdominal pain  CHEST - 2 VIEW  Comparison: 09/12/2011 abdominal CT, 01/18/2011 chest radiograph  Findings: Mild cardiomegaly.  Mediastinal contours otherwise within normal range.  Mild central peribronchial thickening is similar to prior.  No pleural effusion or pneumothorax.  Surgical clips right upper quadrant.  No acute osseous finding  IMPRESSION: Cardiomegaly and mild bronchitic changes, similar to prior.  No focal consolidation.   Original Report Authenticated By: Jearld Lesch,  M.D.    ECG:  Date: 07/30/2013 2216  Rate: 101  Rhythm: sinus tachycardia  QRS Axis: normal  Intervals: normal  ST/T Wave abnormalities: nonspecific ST/T changes  Conduction Disutrbances:none  Narrative Interpretation: Sinus tachycardia, nonspecific ST  changes, T wave inversion in inferior and anterolateral leads. When compared with ECG of 09/13/2011, ST and T changes are new.  Old EKG Reviewed: changes noted   Date: 07/30/2013 2259  Rate: 98  Rhythm: normal sinus rhythm  QRS Axis: normal  Intervals: normal  ST/T Wave abnormalities: nonspecific ST/T changes  Conduction Disutrbances:none  Narrative Interpretation: Nonspecific ST and T changes. When compared with ECG of earlier today, T wave inversion has resolved.  Old EKG Reviewed: changes noted   MDM   1. Epigastric pain   2. Nausea & vomiting   3. Abnormal ECG    Epigastric pain with vomiting-etiology unclear. Old records are reviewed and she had been admitted last week and diagnosed with a viral gastroenteritis. ECG shows T wave inversion in the inferior and anterolateral leads which spontaneously reversed. She may need cardiac evaluation. She will be given IV fluids, IV hydromorphone, IV ondansetron, IV pantoprazole. Laboratory workup initially shows a leukocytosis but with normal liver functions and lipase. Lactic acid level and troponin have been ordered.  Workup is otherwise unremarkable are. Case is discussed with Dr. Toniann Fail who agrees to admit the patient to observation status.    Dione Booze, MD 07/31/13 [phone]

## 2013-07-31 ENCOUNTER — Observation Stay (HOSPITAL_COMMUNITY): Payer: Medicaid Other

## 2013-07-31 ENCOUNTER — Emergency Department (HOSPITAL_COMMUNITY): Payer: Medicaid Other

## 2013-07-31 ENCOUNTER — Encounter (HOSPITAL_COMMUNITY): Payer: Self-pay | Admitting: Internal Medicine

## 2013-07-31 DIAGNOSIS — R1013 Epigastric pain: Secondary | ICD-10-CM

## 2013-07-31 DIAGNOSIS — R112 Nausea with vomiting, unspecified: Secondary | ICD-10-CM

## 2013-07-31 DIAGNOSIS — R9431 Abnormal electrocardiogram [ECG] [EKG]: Secondary | ICD-10-CM

## 2013-07-31 LAB — POCT PREGNANCY, URINE: Preg Test, Ur: NEGATIVE

## 2013-07-31 LAB — COMPREHENSIVE METABOLIC PANEL
Albumin: 2.9 g/dL — ABNORMAL LOW (ref 3.5–5.2)
Alkaline Phosphatase: 53 U/L (ref 39–117)
CO2: 21 mEq/L (ref 19–32)
Calcium: 7.7 mg/dL — ABNORMAL LOW (ref 8.4–10.5)
Chloride: 106 mEq/L (ref 96–112)
Creatinine, Ser: 0.93 mg/dL (ref 0.50–1.10)
GFR calc Af Amer: >90 mL/min (ref >90)
Sodium: 138 mEq/L (ref 135–145)
Total Protein: 5.6 g/dL — ABNORMAL LOW (ref 6.0–8.3)

## 2013-07-31 LAB — URINALYSIS, ROUTINE W REFLEX MICROSCOPIC
Bilirubin Urine: NEGATIVE
Glucose, UA: NEGATIVE mg/dL
Ketones, ur: NEGATIVE mg/dL
pH: 5.5 (ref 5.0–8.0)

## 2013-07-31 LAB — CK: Total CK: 49 U/L (ref 7–177)

## 2013-07-31 LAB — CBC WITH DIFFERENTIAL/PLATELET
Basophils Absolute: 0 10*3/uL (ref 0.0–0.1)
Eosinophils Relative: 0 % (ref 0–5)
HCT: 34.4 % — ABNORMAL LOW (ref 36.0–46.0)
Hemoglobin: 12 g/dL (ref 12.0–15.0)
Lymphocytes Relative: 12 % (ref 12–46)
Lymphs Abs: 0.9 10*3/uL (ref 0.7–4.0)
MCV: 82.5 fL (ref 78.0–100.0)
Monocytes Absolute: 0.9 10*3/uL (ref 0.1–1.0)
Monocytes Relative: 11 % (ref 3–12)
Neutro Abs: 6 10*3/uL (ref 1.7–7.7)
RBC: 4.17 MIL/uL (ref 3.87–5.11)
WBC: 7.8 10*3/uL (ref 4.0–10.5)

## 2013-07-31 LAB — POCT I-STAT TROPONIN I: Troponin i, poc: 0 ng/mL (ref 0.00–0.08)

## 2013-07-31 LAB — TROPONIN I
Troponin I: <0.3 ng/mL (ref <0.30)
Troponin I: <0.3 ng/mL (ref <0.30)

## 2013-07-31 LAB — CG4 I-STAT (LACTIC ACID): Lactic Acid, Venous: 1.16 mmol/L (ref 0.5–2.2)

## 2013-07-31 MED ORDER — MYCOPHENOLATE MOFETIL 250 MG PO CAPS
500.0000 mg | ORAL_CAPSULE | Freq: Three times a day (TID) | ORAL | Status: DC
  Administered 2013-07-31 – 2013-08-01 (×4): 500 mg via ORAL
  Filled 2013-07-31 (×6): qty 2

## 2013-07-31 MED ORDER — ACETAMINOPHEN 650 MG RE SUPP: 650.0000 mg | Freq: Four times a day (QID) | RECTAL | Status: DC | PRN

## 2013-07-31 MED ORDER — ACETAMINOPHEN 325 MG PO TABS
650.0000 mg | ORAL_TABLET | Freq: Four times a day (QID) | ORAL | Status: DC | PRN
  Administered 2013-07-31: 650 mg via ORAL
  Filled 2013-07-31: qty 2

## 2013-07-31 MED ORDER — IBUPROFEN 400 MG PO TABS
400.0000 mg | ORAL_TABLET | Freq: Three times a day (TID) | ORAL | Status: DC | PRN
  Administered 2013-07-31: 400 mg via ORAL
  Filled 2013-07-31: qty 1

## 2013-07-31 MED ORDER — SODIUM CHLORIDE 0.9 % IV SOLN
INTRAVENOUS | Status: AC
  Administered 2013-07-31: 06:00:00 via INTRAVENOUS

## 2013-07-31 MED ORDER — PREDNISONE 5 MG PO TABS
5.0000 mg | ORAL_TABLET | Freq: Every day | ORAL | Status: DC
Start: 2013-07-31 — End: 2013-08-01
  Administered 2013-07-31 – 2013-08-01 (×2): 5 mg via ORAL
  Filled 2013-07-31 (×3): qty 1

## 2013-07-31 MED ORDER — PANTOPRAZOLE SODIUM 40 MG PO TBEC
40.0000 mg | DELAYED_RELEASE_TABLET | Freq: Every day | ORAL | Status: DC
  Administered 2013-07-31 – 2013-08-01 (×2): 40 mg via ORAL
  Filled 2013-07-31 (×2): qty 1

## 2013-07-31 MED ORDER — CYCLOSPORINE 100 MG/ML PO SOLN
90.0000 mg | Freq: Two times a day (BID) | ORAL | Status: DC
Start: 2013-07-31 — End: 2013-08-01
  Administered 2013-07-31 – 2013-08-01 (×3): 90 mg via ORAL
  Filled 2013-07-31 (×4): qty 0.9

## 2013-07-31 MED ORDER — HEPARIN SODIUM (PORCINE) 5000 UNIT/ML IJ SOLN
5000.0000 [IU] | Freq: Three times a day (TID) | INTRAMUSCULAR | Status: DC
  Administered 2013-07-31 – 2013-08-01 (×2): 5000 [IU] via SUBCUTANEOUS
  Filled 2013-07-31 (×6): qty 1

## 2013-07-31 MED ORDER — ONDANSETRON HCL 4 MG PO TABS: 4.0000 mg | ORAL_TABLET | Freq: Four times a day (QID) | ORAL | Status: DC | PRN

## 2013-07-31 MED ORDER — IBUPROFEN 100 MG PO CHEW: 400.0000 mg | CHEWABLE_TABLET | Freq: Three times a day (TID) | ORAL | Status: DC | PRN

## 2013-07-31 MED ORDER — ONDANSETRON HCL 4 MG/2ML IJ SOLN: 4.0000 mg | Freq: Four times a day (QID) | INTRAMUSCULAR | Status: DC | PRN

## 2013-07-31 MED ORDER — SODIUM CHLORIDE 0.9 % IJ SOLN
3.0000 mL | Freq: Two times a day (BID) | INTRAMUSCULAR | Status: DC
  Administered 2013-07-31 – 2013-08-01 (×3): 3 mL via INTRAVENOUS

## 2013-07-31 NOTE — Progress Notes (Signed)
[facility]                                                                                Patient Demographics  Sarah Dyer, is a 37 y.o. female, DOB - 09-08-1976, ZOX:[id number], WJX:[id number]  Admit date - 07/30/2013  Admitting Physician Eduard Clos, MD  Outpatient Primary MD for the patient is DETERDING,JAMES L, MD  LOS - 1   Chief Complaint  Patient presents with  . Abdominal Pain        Assessment & Plan    Nausea vomiting and epigastric pain - most likely secondary to gastroenteritis. stable KUB at this time. Betti Cruz feels better with supportive care, Advance diet as tolerated. We'll check gastroenteritis panel.      Abnormal EKG with diffuse T-wave changes - cycle cardiac markers. Check CK levels. Check 2-D echo and sedimentation rate.     Leukocytosis - patient has mild fever.  likely due to gastroenteritis, which a GI pathogen panel, pending blood cultures. I have not started any antibiotics at this time.     History of renal transplant - continue immunosuppressants.     Code Status: full  Family Communication: none present  Disposition Plan: home   Procedures     Consults      DVT Prophylaxis   Heparin    Lab Results  Component Value Date   PLT 225 07/31/2013    Medications  Scheduled Meds: . cycloSPORINE  90 mg Oral BID  . mycophenolate  500 mg Oral TID  . pantoprazole  40 mg Oral Daily  . predniSONE  5 mg Oral Q breakfast  . sodium chloride  3 mL Intravenous Q12H   Continuous Infusions: . sodium chloride 100 mL/hr at 07/31/13 0623   PRN Meds:.acetaminophen, acetaminophen, ibuprofen, ondansetron (ZOFRAN) IV, ondansetron  Antibiotics     Anti-infectives   None       Time Spent in minutes   40   Susa Raring K M.D on 07/31/2013 at 11:15 AM  Between 7am to 7pm - Pager - [phone]  After 7pm go to [email or web address] - password [email or web address]  And look for the night coverage person covering for me after  hours  [facility] Office  [phone]    Subjective:   Sarah Dyer today has, No headache, No chest pain, No abdominal pain - No Nausea, No new weakness tingling or numbness, No Cough - SOB.    Objective:   Filed Vitals:   07/31/13 0145 07/31/13 0212 07/31/13 0459 07/31/13 0845  BP: 112/66 118/63 102/51 104/65  Pulse: 97 86 83 76  Temp:  100.2 F (37.9 C) 99.9 F (37.7 C) 99.5 F (37.5 C)  TempSrc:  Oral Oral Oral  Resp: 24 20 18 18   Height:  5\' 2"  (1.575 m)    Weight:  54.9 kg (121 lb 0.5 oz)    SpO2: 100% 97% 98% 98%    Wt Readings from Last 3 Encounters:  07/31/13 54.9 kg (121 lb 0.5 oz)  07/25/13 54.432 kg (120 lb)  04/15/12 59.149 kg (130 lb 6.4 oz)     Intake/Output Summary (Last 24 hours) at 07/31/13 1115 Last data filed at 07/31/13 0945  Gross  per 24 hour  Intake    610 ml  Output   1050 ml  Net   -440 ml    Exam Awake Alert, Oriented X 3, No new F.N deficits, Normal affect [place].AT,PERRAL Supple Neck,No JVD, No cervical lymphadenopathy appriciated.  Symmetrical Chest wall movement, Good air movement bilaterally, CTAB RRR,No Gallops,Rubs or new Murmurs, No Parasternal Heave +ve B.Sounds, Abd Soft, Non tender, No organomegaly appriciated, No rebound - guarding or rigidity. No Cyanosis, Clubbing or edema, No new Rash or bruise     Data Review   Micro Results Recent Results (from the past 240 hour(s))  STOOL CULTURE     Status: None   Collection Time    07/24/13  9:16 AM      Result Value Range Status   Specimen Description STOOL   Final   Special Requests NONE   Final   Culture     Final   Value: NO SALMONELLA, SHIGELLA, CAMPYLOBACTER, YERSINIA, OR E.COLI 0157:H7 ISOLATED     Performed at [facility]   Report Status 07/28/2013 FINAL   Final  CLOSTRIDIUM DIFFICILE BY PCR     Status: None   Collection Time    07/24/13  9:16 AM      Result Value Range Status   C difficile by pcr NEGATIVE  NEGATIVE Final     Radiology Reports Dg Chest 2 View  07/31/2013   *RADIOLOGY REPORT*  Clinical Data: Abdominal pain  CHEST - 2 VIEW  Comparison: 09/12/2011 abdominal CT, 01/18/2011 chest radiograph  Findings: Mild cardiomegaly.  Mediastinal contours otherwise within normal range.  Mild central peribronchial thickening is similar to prior.  No pleural effusion or pneumothorax.  Surgical clips right upper quadrant.  No acute osseous finding  IMPRESSION: Cardiomegaly and mild bronchitic changes, similar to prior.  No focal consolidation.   Original Report Authenticated By: Jearld Lesch, M.D.   Dg Abd 1 View  07/31/2013   *RADIOLOGY REPORT*  Clinical Data: Nausea, vomiting, abdominal pain  ABDOMEN - 1 VIEW  Comparison: CT abdomen pelvis - 07/24/2013  Findings:  Paucity of bowel gas without evidence of obstruction.  Evaluation for pneumoperitoneum is degraded secondary to supine positioning and exclusion of the lower thorax.  No definite pneumatosis or portal venous gas.  No definite abnormal intra-abdominal calcifications.  Post cholecystectomy.  No acute osseous abnormalities.  IMPRESSION: Paucity of bowel gas without evidence of obstruction.   Original Report Authenticated By: Tacey Ruiz, MD       CBC  Recent Labs Lab 07/25/13 0450 07/30/13 2230 07/31/13 0400  WBC 6.0 16.4* 7.8  HGB 12.2 14.1 12.0  HCT 36.3 40.5 34.4*  PLT 255 297 225  MCV 83.8 82.2 82.5  MCH 28.2 28.6 28.8  MCHC 33.6 34.8 34.9  RDW 13.5 13.1 13.1  LYMPHSABS  --  1.8 0.9  MONOABS  --  0.5 0.9  EOSABS  --  0.0 0.0  BASOSABS  --  0.0 0.0    Chemistries   Recent Labs Lab 07/25/13 0450 07/30/13 2230 07/31/13 0400  NA 136 135 138  K 4.1 3.9 4.0  CL 104 100 106  CO2 22 22 21   GLUCOSE 87 121* 96  BUN 12 18 17   CREATININE 1.04 1.05 0.93  CALCIUM 8.5 9.3 7.7*  AST  --  24 18  ALT  --  18 13  ALKPHOS  --  73 53  BILITOT  --  0.5 0.6    ------------------------------------------------------------------------------------------------------------------ estimated creatinine clearance is  66.8 ml/min (by C-G formula based on Cr of 0.93). ------------------------------------------------------------------------------------------------------------------ No results found for this basename: HGBA1C,  in the last 72 hours ------------------------------------------------------------------------------------------------------------------ No results found for this basename: CHOL, HDL, LDLCALC, TRIG, CHOLHDL, LDLDIRECT,  in the last 72 hours ------------------------------------------------------------------------------------------------------------------ No results found for this basename: TSH, T4TOTAL, FREET3, T3FREE, THYROIDAB,  in the last 72 hours ------------------------------------------------------------------------------------------------------------------ No results found for this basename: VITAMINB12, FOLATE, FERRITIN, TIBC, IRON, RETICCTPCT,  in the last 72 hours  Coagulation profile No results found for this basename: INR, PROTIME,  in the last 168 hours  No results found for this basename: DDIMER,  in the last 72 hours  Cardiac Enzymes  Recent Labs Lab 07/31/13 0208 07/31/13 0900  TROPONINI <0.30 <0.30   ------------------------------------------------------------------------------------------------------------------ No components found with this basename: POCBNP,

## 2013-07-31 NOTE — ED Notes (Signed)
Charito, RN on 6E notified that pt needs DG Abd prior to going to floor per request of Dr. Toniann Fail. Xray made aware as well

## 2013-07-31 NOTE — ED Notes (Signed)
Dr K at bedside.

## 2013-07-31 NOTE — Progress Notes (Signed)
*  PRELIMINARY RESULTS* Echocardiogram 2D Echocardiogram has been performed.  Sarah Dyer 07/31/2013, 12:12 PM

## 2013-07-31 NOTE — Progress Notes (Signed)
Utilization review completed.  , RN, BSN. 

## 2013-07-31 NOTE — H&P (Signed)
[facility] History and Physical  [name] MVH:[id number] DOB: 19-Dec-1975 DOA: 07/30/2013  Referring physician: ER physician. PCP: Trevor Iha, MD   Chief Complaint: Nausea vomiting and epigastric pain.  HPI: Sarah Dyer is a 37 y.o. female was recently admitted for possible gastroenteritis presents with complaints of epigastric pain with nausea vomiting since last evening. Patient's pain is mostly in the epigastric area with nausea and vomiting. Patient did not have any chest pain or shortness of breath. Denies any fever chills. Denies any diarrhea this time. Since patient had epigastric pain patient had EKG done in the ER which showed diffuse T-wave changes which was not present on the previous EKG. Troponins are negative. Patient has been admitted for further management. Patient's epigastric pain and nausea is improved at this time.  Review of Systems: As presented in the history of presenting illness, rest negative.  Past Medical History  Diagnosis Date  . Chronic kidney disease   . Gastritis    Past Surgical History  Procedure Laterality Date  . Kidney transplant  January2009  . Cholecystectomy    . Tubal ligation     Social History:  reports that she has never smoked. She has never used smokeless tobacco. She reports that she does not drink alcohol. Her drug history is not on file. Home. where does patient live-- Can do ADLs. Can patient participate in ADLs?  No Known Allergies  History reviewed. No pertinent family history.    Prior to Admission medications   Medication Sig Start Date End Date Taking? Authorizing Provider  acetaminophen (TYLENOL) 500 MG tablet Take 500 mg by mouth every 6 (six) hours as needed for pain.   Yes Historical Provider, MD  cycloSPORINE (SANDIMMUNE) 100 MG/ML solution Take 90 mg by mouth 2 (two) times daily.   Yes Historical Provider, MD  mycophenolate (CELLCEPT) 500 MG tablet Take 500 mg by mouth 3 (three)  times daily.   Yes Historical Provider, MD  omeprazole (PRILOSEC) 20 MG capsule Take 20 mg by mouth 2 (two) times daily.   Yes Historical Provider, MD  predniSONE (DELTASONE) 5 MG tablet Take 5 mg by mouth daily.   Yes Historical Provider, MD   Physical Exam: Filed Vitals:   07/31/13 0000 07/31/13 0115 07/31/13 0130 07/31/13 0145  BP: 123/59 119/62 114/63 112/66  Pulse: 87 89 88 97  Temp:      TempSrc:      Resp: 17  27 24   SpO2: 100% 98% 98% 100%     General:  Well-developed well-nourished.  Eyes: Anicteric no pallor.  ENT: No discharge from ears eyes nose mouth.  Neck: No mass felt.  Cardiovascular: S1-S2 heard.  Respiratory: No rhonchi or crepitations.  Abdomen: Soft nontender bowel sounds present.  Skin: No rash.  Musculoskeletal: No edema.  Psychiatric: Appears normal.  Neurologic: Alert and oriented to time place and person.  Labs on Admission:  Basic Metabolic Panel:  Recent Labs Lab 07/24/13 1045 07/25/13 0450 07/30/13 2230  NA 137 136 135  K 4.0 4.1 3.9  CL 104 104 100  CO2 21 22 22   GLUCOSE 119* 87 121*  BUN 15 12 18   CREATININE 1.09 1.04 1.05  CALCIUM 8.4 8.5 9.3   Liver Function Tests:  Recent Labs Lab 07/30/13 2230  AST 24  ALT 18  ALKPHOS 73  BILITOT 0.5  PROT 7.2  ALBUMIN 3.7    Recent Labs Lab 07/30/13 2230  LIPASE 25   No results found for this basename: AMMONIA,  in  the last 168 hours CBC:  Recent Labs Lab 07/25/13 0450 07/30/13 2230  WBC 6.0 16.4*  NEUTROABS  --  14.1*  HGB 12.2 14.1  HCT 36.3 40.5  MCV 83.8 82.2  PLT 255 297   Cardiac Enzymes: No results found for this basename: CKTOTAL, CKMB, CKMBINDEX, TROPONINI,  in the last 168 hours  BNP (last 3 results) No results found for this basename: PROBNP,  in the last 8760 hours CBG: No results found for this basename: GLUCAP,  in the last 168 hours  Radiological Exams on Admission: Dg Chest 2 View  07/31/2013   *RADIOLOGY REPORT*  Clinical Data:  Abdominal pain  CHEST - 2 VIEW  Comparison: 09/12/2011 abdominal CT, 01/18/2011 chest radiograph  Findings: Mild cardiomegaly.  Mediastinal contours otherwise within normal range.  Mild central peribronchial thickening is similar to prior.  No pleural effusion or pneumothorax.  Surgical clips right upper quadrant.  No acute osseous finding  IMPRESSION: Cardiomegaly and mild bronchitic changes, similar to prior.  No focal consolidation.   Original Report Authenticated By: Jearld Lesch, M.D.    EKG: Independently reviewed. Normal sinus rhythm with diffuse T-wave changes.  Assessment/Plan Principal Problem:   Nausea & vomiting Active Problems:   History of kidney transplant   Epigastric pain   Abnormal ECG   1. Nausea vomiting and epigastric pain - most likely secondary to gastroenteritis. Check KUB at this time. Advance diet as tolerated. 2. Abnormal EKG with diffuse T-wave changes - cycle cardiac markers. Check CK levels. Check 2-D echo and sedimentation rate. 3. Leukocytosis - patient has mild fever. There is no definite source at this time. Patient has not had diarrhea during this admission. If patient has the need to check stool studies. Check blood cultures. I have not started any antibiotics at this time. 4. History of renal transplant - continue immunosuppressants.    Code Status: Full code.  Family Communication: None.  Disposition Plan: Admit to inpatient.    , N. [facility] Pager [phone].  If 7PM-7AM, please contact night-coverage [email or web address] Password [facility] 07/31/2013, 2:11 AM

## 2013-08-01 ENCOUNTER — Other Ambulatory Visit: Payer: Self-pay

## 2013-08-01 LAB — BASIC METABOLIC PANEL
BUN: 9 mg/dL (ref 6–23)
CO2: 23 mEq/L (ref 19–32)
Calcium: 8.6 mg/dL (ref 8.4–10.5)
Glucose, Bld: 103 mg/dL — ABNORMAL HIGH (ref 70–99)
Potassium: 3.4 mEq/L — ABNORMAL LOW (ref 3.5–5.1)
Sodium: 139 mEq/L (ref 135–145)

## 2013-08-01 LAB — CBC
HCT: 34.2 % — ABNORMAL LOW (ref 36.0–46.0)
Hemoglobin: 11.6 g/dL — ABNORMAL LOW (ref 12.0–15.0)
MCH: 28.4 pg (ref 26.0–34.0)
MCHC: 33.9 g/dL (ref 30.0–36.0)
RBC: 4.08 MIL/uL (ref 3.87–5.11)

## 2013-08-01 MED ORDER — POTASSIUM CHLORIDE CRYS ER 20 MEQ PO TBCR
40.0000 meq | EXTENDED_RELEASE_TABLET | Freq: Once | ORAL | Status: AC
  Administered 2013-08-01: 40 meq via ORAL
  Filled 2013-08-01: qty 2

## 2013-08-01 MED ORDER — OMEPRAZOLE 20 MG PO CPDR: 40.0000 mg | DELAYED_RELEASE_CAPSULE | Freq: Two times a day (BID) | ORAL | Status: DC

## 2013-08-01 NOTE — Discharge Summary (Addendum)
[facility]                                                                                   Sarah Dyer, is a 37 y.o. female  DOB May 15, 1976  MRN [id number].  Admission date:  07/30/2013  Discharge Date:  08/01/2013  Primary MD  DETERDING,JAMES L, MD  Admitting Physician  Eduard Clos, MD  Admission Diagnosis  Epigastric pain [789.06] Abnormal ECG [794.31] Nausea & vomiting [787.01]  Discharge Diagnosis     Principal Problem:   Nausea & vomiting Active Problems:   History of kidney transplant   Epigastric pain   Abnormal ECG   Past Medical History  Diagnosis Date  . Chronic kidney disease   . Gastritis     Past Surgical History  Procedure Laterality Date  . Kidney transplant  January2009  . Cholecystectomy    . Tubal ligation       Recommendations for primary care physician for things to follow:      Discharge Diagnoses:   Principal Problem:   Nausea & vomiting Active Problems:   History of kidney transplant   Epigastric pain   Abnormal ECG    Discharge Condition: stable   Diet recommendation: See Discharge Instructions below   Consults Cards Dr Tenny Craw over phone    History of present illness and  Hospital Course:     Kindly see H&P for history of present illness and admission details, please review complete Labs, Consult reports and Test reports for all details in brief [place], is a 37 y.o. female, patient with history of kidney transplant, chronic gastritis, does not smoke or consume any regular alcohol presented to the hospital with nausea vomiting along with diarrhea and low-grade fevers likely secondary to viral gastroenteritis which has completely resolved now, she never had any chest pain, she had a CT scan which was also suggestive of stool in the colon secondary to gastroenteritis, she had blood cultures drawn which are negative to date, final culture results are still pending will request primary care  physician to kindly follow on those. Have instructed patient and her daughter to follow with PCP on Monday and follow on final culture results. Have also requested patient to follow with GI one time due to her recurrent gastritis symptoms.   She also had nonspecific EKG changes on admission in the ER, serial troponins were negative, repeat EKG along with echo gram were unremarkable, this was discussed in detail with cardiologist on call Dr. Tenny Craw who recommended outpatient cardiology followup and no further workup in the inpatient setting or any new medications.    Her renal function is at baseline and she will continue to follow with her primary nephrologist post discharge. Her transplant medications will be continued unchanged.       Today   Subjective:   [place]Luc[place] today has no headache,no chest abdominal pain,no new weakness tingling or numbness, feels much better wants to go home today.   Objective:   Blood pressure 110/57, pulse 69, temperature 98.1 F (36.7 C), temperature source Oral, resp. rate 17, height 5\' 2"  (1.575 m), weight 54.477 kg (120 lb 1.6 oz), last menstrual period 07/21/2013,  SpO2 100.00%.   Intake/Output Summary (Last 24 hours) at 08/01/13 1125 Last data filed at 08/01/13 0658  Gross per 24 hour  Intake    480 ml  Output   4075 ml  Net  -3595 ml    Exam Awake Alert, Oriented *3, No new F.N deficits, Normal affect [place].AT,PERRAL Supple Neck,No JVD, No cervical lymphadenopathy appriciated.  Symmetrical Chest wall movement, Good air movement bilaterally, CTAB RRR,No Gallops,Rubs or new Murmurs, No Parasternal Heave +ve B.Sounds, Abd Soft, Non tender, No organomegaly appriciated, No rebound -guarding or rigidity. No Cyanosis, Clubbing or edema, No new Rash or bruise  Data Review   Major procedures and Radiology Reports - PLEASE review detailed and final reports for all details, in brief -   Echo  Left ventricle: The cavity size was normal.  Systolic function was normal. The estimated ejection fraction was in the range of 55% to 60%. Wall motion was normal; there were no regional wall motion abnormalities.   Dg Chest 2 View  07/31/2013   *RADIOLOGY REPORT*  Clinical Data: Abdominal pain  CHEST - 2 VIEW  Comparison: 09/12/2011 abdominal CT, 01/18/2011 chest radiograph  Findings: Mild cardiomegaly.  Mediastinal contours otherwise within normal range.  Mild central peribronchial thickening is similar to prior.  No pleural effusion or pneumothorax.  Surgical clips right upper quadrant.  No acute osseous finding  IMPRESSION: Cardiomegaly and mild bronchitic changes, similar to prior.  No focal consolidation.   Original Report Authenticated By: Jearld Lesch, M.D.   Dg Abd 1 View  07/31/2013   *RADIOLOGY REPORT*  Clinical Data: Nausea, vomiting, abdominal pain  ABDOMEN - 1 VIEW  Comparison: CT abdomen pelvis - 07/24/2013  Findings:  Paucity of bowel gas without evidence of obstruction.  Evaluation for pneumoperitoneum is degraded secondary to supine positioning and exclusion of the lower thorax.  No definite pneumatosis or portal venous gas.  No definite abnormal intra-abdominal calcifications.  Post cholecystectomy.  No acute osseous abnormalities.  IMPRESSION: Paucity of bowel gas without evidence of obstruction.   Original Report Authenticated By: Tacey Ruiz, MD   Ct Abdomen Pelvis W Contrast  07/24/2013   *RADIOLOGY REPORT*  Clinical Data: Epigastric and left upper quadrant pain radiating to the back.  Nausea, vomiting, and diarrhea starting at 05:00 p.m. today.  History of kidney transplant.  CT ABDOMEN AND PELVIS WITH CONTRAST  Technique:  Multidetector CT imaging of the abdomen and pelvis was performed following the standard protocol during bolus administration of intravenous contrast.  Contrast: 80mL OMNIPAQUE IOHEXOL 300 MG/ML  SOLN  Comparison: 09/12/2011  Findings: Small focal area of atelectasis or infiltration in the left lung  base.  Mild diffuse fatty infiltration of the liver.  Surgical absence of the gallbladder.  Local bile duct dilatation and the posterior segment right lobe, stable since previous study.  No enhancing mass lesion is appreciated.  The pancreas, spleen, adrenal glands, abdominal aorta, and inferior vena cava are unremarkable.  No retroperitoneal lymphadenopathy.  Marked atrophy of the native kidneys.  The stomach is normal.  Fluid-filled small bowel with suggestion of diffuse small bowel wall thickening.  This could represent small bowel edema or enteritis.  No free air or free fluid in the abdomen.  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  No hydronephrosis.  The uterus and ovaries are not enlarged.  Fluid throughout the colon consistent with history of diarrhea.  No definite colonic wall thickening.  No free or loculated pelvic  fluid collections.  Appendix is not identified. Normal alignment of the lumbar vertebrae.  No destructive bone lesions appreciated.  IMPRESSION: Fluid-filled thick walled small bowel without distension suggesting bowel edema or enteritis.  Fluid filled colon consistent with history of diarrhea.  Atrophy of the native kidneys.  Normal appearing right pelvic transplant kidney.   Original Report Authenticated By: Burman Nieves, M.D.    Micro Results      Recent Results (from the past 240 hour(s))  STOOL CULTURE     Status: None   Collection Time    07/24/13  9:16 AM      Result Value Range Status   Specimen Description STOOL   Final   Special Requests NONE   Final   Culture     Final   Value: NO SALMONELLA, SHIGELLA, CAMPYLOBACTER, YERSINIA, OR E.COLI 0157:H7 ISOLATED     Performed at [facility]   Report Status 07/28/2013 FINAL   Final  CLOSTRIDIUM DIFFICILE BY PCR     Status: None   Collection Time    07/24/13  9:16 AM      Result Value Range Status   C difficile by pcr NEGATIVE  NEGATIVE Final  CULTURE, BLOOD  (ROUTINE X 2)     Status: None   Collection Time    07/31/13  4:00 AM      Result Value Range Status   Specimen Description BLOOD RIGHT HAND   Final   Special Requests BOTTLES DRAWN AEROBIC ONLY 5CC   Final   Culture  Setup Time     Final   Value: 07/31/2013 10:00     Performed at [facility]   Culture     Final   Value:        BLOOD CULTURE RECEIVED NO GROWTH TO DATE CULTURE WILL BE HELD FOR 5 DAYS BEFORE ISSUING A FINAL NEGATIVE REPORT     Performed at [facility]   Report Status PENDING   Incomplete  CULTURE, BLOOD (ROUTINE X 2)     Status: None   Collection Time    07/31/13  4:05 AM      Result Value Range Status   Specimen Description BLOOD RIGHT HAND   Final   Special Requests BOTTLES DRAWN AEROBIC ONLY 5CC   Final   Culture  Setup Time     Final   Value: 07/31/2013 10:00     Performed at [facility]   Culture     Final   Value:        BLOOD CULTURE RECEIVED NO GROWTH TO DATE CULTURE WILL BE HELD FOR 5 DAYS BEFORE ISSUING A FINAL NEGATIVE REPORT     Performed at [facility]   Report Status PENDING   Incomplete     CBC w Diff: Lab Results  Component Value Date   WBC 5.1 08/01/2013   HGB 11.6* 08/01/2013   HCT 34.2* 08/01/2013   PLT 221 08/01/2013   LYMPHOPCT 12 07/31/2013   MONOPCT 11 07/31/2013   EOSPCT 0 07/31/2013   BASOPCT 0 07/31/2013    CMP: Lab Results  Component Value Date   NA 139 08/01/2013   K 3.4* 08/01/2013   CL 106 08/01/2013   CO2 23 08/01/2013   BUN 9 08/01/2013   CREATININE 1.08 08/01/2013   PROT 5.6* 07/31/2013   ALBUMIN 2.9* 07/31/2013   BILITOT 0.6 07/31/2013   ALKPHOS 53 07/31/2013   AST 18 07/31/2013   ALT 13 07/31/2013  .  Discharge Instructions    Follow with Primary MD DETERDING,JAMES L, MD in 3 days , follow on final culture results.  Get CBC, CMP, checked 7 days by Primary MD and again as instructed by your Primary MD. Get a 2 view Chest X ray done next visit if you had Pneumonia of Lung problems at the  Hospital.  Get Medicines reviewed and adjusted.  Please request your Prim.MD to go over all Hospital Tests and Procedure/Radiological results at the follow up, please get all Hospital records sent to your Prim MD by signing hospital release before you go home.  Activity: As tolerated with Full fall precautions use walker/cane & assistance as needed   Diet:  Heart Health  For Heart failure patients - Check your Weight same time everyday, if you gain over 2 pounds, or you develop in leg swelling, experience more shortness of breath or chest pain, call your Primary MD immediately. Follow Cardiac Low Salt Diet and 1.8 lit/day fluid restriction.  Disposition Home   If you experience worsening of your admission symptoms, develop shortness of breath, life threatening emergency, suicidal or homicidal thoughts you must seek medical attention immediately by calling 911 or calling your MD immediately  if symptoms less severe.  You Must read complete instructions/literature along with all the possible adverse reactions/side effects for all the Medicines you take and that have been prescribed to you. Take any new Medicines after you have completely understood and accpet all the possible adverse reactions/side effects.   Do not drive and provide baby sitting services if your were admitted for syncope or siezures until you have seen by Primary MD or a Neurologist and advised to do so again.  Do not drive when taking Pain medications.    Do not take more than prescribed Pain, Sleep and Anxiety Medications  Special Instructions: If you have smoked or chewed Tobacco  in the last 2 yrs please stop smoking, stop any regular Alcohol  and or any Recreational drug use.  Wear Seat belts while driving.   Please note  You were cared for by a hospitalist during your hospital stay. If you have any questions about your discharge medications or the care you received while you were in the hospital after you are  discharged, you can call the unit and asked to speak with the hospitalist on call if the hospitalist that took care of you is not available. Once you are discharged, your primary care physician will handle any further medical issues. Please note that NO REFILLS for any discharge medications will be authorized once you are discharged, as it is imperative that you return to your primary care physician (or establish a relationship with a primary care physician if you do not have one) for your aftercare needs so that they can reassess your need for medications and monitor your lab values.   Follow-up Information   Follow up with DETERDING,JAMES L, MD. Schedule an appointment as soon as possible for a visit in 3 days.   Specialty:  Nephrology   Contact information:   [address] [facility] [place] [address] [phone]       Follow up with Florencia Reasons, MD. Schedule an appointment as soon as possible for a visit in 2 weeks.   Specialty:  Gastroenterology   Contact information:   [address]  [place] [address] [phone]       Follow up with Willa Rough, MD. Schedule an appointment as soon as possible for a visit in 1 week. (or Dr Lovina Reach)    Specialty:  Cardiology   Contact information:   [address] [place] [address] [phone]         Discharge Medications     Medication List         acetaminophen 500 MG tablet  Commonly known as:  TYLENOL  Take 500 mg by mouth every 6 (six) hours as needed for pain.     cycloSPORINE 100 MG/ML solution  Commonly known as:  SANDIMMUNE  Take 90 mg by mouth 2 (two) times daily.     mycophenolate 500 MG tablet  Commonly known as:  CELLCEPT  Take 500 mg by mouth 3 (three) times daily.     omeprazole 20 MG capsule  Commonly known as:  PRILOSEC  Take 2 capsules (40 mg total) by mouth 2 (two) times daily.     predniSONE 5 MG tablet   Commonly known as:  DELTASONE  Take 5 mg by mouth daily.           Total Time in preparing paper work, data evaluation and todays exam - 35 minutes  Leroy Sea M.D on 08/01/2013 at 11:25 AM  [facility] Office  [phone]

## 2013-08-06 LAB — CULTURE, BLOOD (ROUTINE X 2): Culture: NO GROWTH

## 2013-08-14 ENCOUNTER — Ambulatory Visit (INDEPENDENT_AMBULATORY_CARE_PROVIDER_SITE_OTHER): Payer: No Typology Code available for payment source | Admitting: Internal Medicine

## 2013-08-14 ENCOUNTER — Encounter: Payer: Self-pay | Admitting: Internal Medicine

## 2013-08-14 VITALS — BP 118/62 | HR 73 | Ht 62.0 in | Wt 122.0 lb

## 2013-08-14 DIAGNOSIS — R1013 Epigastric pain: Secondary | ICD-10-CM

## 2013-08-14 DIAGNOSIS — R9431 Abnormal electrocardiogram [ECG] [EKG]: Secondary | ICD-10-CM

## 2013-08-14 DIAGNOSIS — Z940 Kidney transplant status: Secondary | ICD-10-CM

## 2013-08-14 NOTE — Patient Instructions (Addendum)
Your physician recommends that you continue on your current medications as directed. Please refer to the Current Medication list given to you today.     

## 2013-08-14 NOTE — Progress Notes (Signed)
HPI Patient is a 37 yo with no known CAD In august she was seen in ER on 8/20 with epigastric pain, N/V and diarrhea.  Sent home  She came back on 8/28 with similar symptoms and fever.  Pain in epigastrum was a boring pain that went to back  Prolonged  Not associated with activity.   EKG in ER showed SR with ST depression in inferior and lateral leads. This was done when she was having pain, was vomiting. The patient was admitted.  CK/Troponin werer negatvie.  Echo done  It was normal. Repeat EKG was normal.   The patient was sent home.  She has been seen in GI (buccini).  She has not had recurrrence.  Note she had similar abdominal symptoms 2 years ago.   The patient has done some walking  Has cut back on fast walking and running until seen. She denies CP  Bowels are OK  No epigastric pain. Patient underwent kidney transplant in [place] in 2009   No records.  Went into kidney failure after birt of child.  No Known Allergies  Current Outpatient Prescriptions  Medication Sig Dispense Refill  . acetaminophen (TYLENOL) 500 MG tablet Take 500 mg by mouth every 6 (six) hours as needed for pain.      . cycloSPORINE (SANDIMMUNE) 100 MG/ML solution Take 90 mg by mouth 2 (two) times daily.      . mycophenolate (CELLCEPT) 500 MG tablet Take 500 mg by mouth 3 (three) times daily.      Marland Kitchen omeprazole (PRILOSEC) 20 MG capsule Take 2 capsules (40 mg total) by mouth 2 (two) times daily.  60 capsule  0  . predniSONE (DELTASONE) 5 MG tablet Take 5 mg by mouth daily.       No current facility-administered medications for this visit.    Past Medical History  Diagnosis Date  . Chronic kidney disease   . Gastritis     Past Surgical History  Procedure Laterality Date  . Kidney transplant  January2009  . Cholecystectomy    . Tubal ligation      No family history on file.  History   Social History  . Marital Status: Married    Spouse Name: N/A    Number of Children: N/A  . Years of Education: N/A    Occupational History  . Not on file.   Social History Main Topics  . Smoking status: Never Smoker   . Smokeless tobacco: Never Used  . Alcohol Use: No  . Drug Use: Not on file  . Sexual Activity: Not on file   Other Topics Concern  . Not on file   Social History Narrative  . No narrative on file    Review of Systems:  All systems reviewed.  They are negative to the above problem except as previously stated.  Vital Signs: BP 118/62  Pulse 73  Ht 5\' 2"  (1.575 m)  Wt 122 lb (55.339 kg)  BMI 22.31 kg/m2  SpO2 98%  LMP 07/21/2013  Physical Exam Patient is in NAD HEENT:  Normocephalic, atraumatic. EOMI, PERRLA.  Neck: JVP is normal.  No bruits.  Lungs: clear to auscultation. No rales no wheezes.  Heart: Regular rate and rhythm. Normal S1, S2. No S3.   No significant murmurs. PMI not displaced.  Abdomen:  Supple, nontender. Normal bowel sounds. No masses. No hepatomegaly.  Extremities:   Good distal pulses throughout. No lower extremity edema.  Musculoskeletal :moving all extremities.  Neuro:   alert and  oriented x3.  CN II-XII grossly intact.  EKG  See HPI  Assessment and Plan:  1. Abnormal EKG  Patient had EKG on 8/28 while she was having epigstric pain, N/V  May be nonspecific, I am not convinced above symptoms represent angina equivalent I recomm that she slowly get back into her usual activity.  Follow as needed  Call if CP or epigastric pain recur.  2.  Epigastric pain.  Interesting she had similar symptoms 2 years agov  Has been seen by GI.  3.  Renal  Followed by J Deterding.   F/U is prn.

## 2013-08-19 NOTE — Discharge Summary (Signed)
Physician Discharge Summary  Sarah Dyer NGE:[id number] DOB: Mar 10, 1976 DOA: 07/23/2013  PCP: Trevor Iha, MD  Admit date: 07/23/2013 Discharge date: 08/19/2013  Time spent: 18 minutes  Recommendations for Outpatient Follow-up:  1. Follow with PCP 2. Follow with Nephrologist.  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  Not recently on Abx, but has been exposed to nephew who has been teething for the last week and who had diarrhea.   Hospital Course:  1. Likely viral GE- Negative Cdiff/Stool cult since on immunosuppressants. started Fluids at 50 cc saline per hour-Bmet am + CBC normalized-tolerated diet overnight and had no further stool/N & V 2. ESRD s/p Live transplant-Stable. Creatinine at [facility]-continue Cyclosporine 90 mg bid, Mycophenolate 500 tid, Prednisone 5 daily  3. Leukocytosis-Potentially 2/2 to infection vs steroid use. Monitor CBC am   Discharge Exam: Filed Vitals:   07/25/13 0507  BP: 97/58  Pulse: 61  Temp: 98.1 F (36.7 C)  Resp: 18    Alert, Pleasant oriented, NAD General: EOMI, NCAt Cardiovascular: s1 s2 no m/r/g Respiratory:  clear  Discharge Instructions      Discharge Orders   Future Orders Complete By Expires   Diet - low sodium heart healthy  As directed    Discharge instructions  As directed    Comments:     You were cared for by a hospitalist during your hospital stay. If you have any questions about your discharge medications or the care you received while you were in the  hospital after you are discharged, you can call the unit and asked to speak with the hospitalist on call if the hospitalist that took care of you is not available. Once you are discharged, your primary care physician will handle any further medical issues. Please note that NO REFILLS for any discharge medications will be authorized once you are discharged, as it is imperative that you return to your primary care physician (or establish a relationship with a primary care physician if you do not have one) for your aftercare needs so that they can reassess your need for medications and monitor your lab values. If you do not have a primary care physician, you can call [phone] for a physician referral.   Increase activity slowly  As directed        Medication List    STOP taking these medications       fish oil-omega-3 fatty acids 1000 MG capsule     omeprazole 20 MG capsule  Commonly known as:  PRILOSEC      TAKE these medications       acetaminophen 500 MG tablet  Commonly known as:  TYLENOL  Take 500 mg by mouth every 6 (six) hours as needed for pain.     cycloSPORINE 100 MG/ML solution  Commonly known as:  SANDIMMUNE  Take 90 mg by mouth 2 (two) times daily.     mycophenolate 500 MG tablet  Commonly known as:  CELLCEPT  Take 500 mg by mouth 3 (three) times daily.  predniSONE 5 MG tablet  Commonly known as:  DELTASONE  Take 5 mg by mouth daily.       No Known Allergies Follow-up Information   Follow up with DETERDING,JAMES L, MD In 1 week.   Specialty:  Nephrology   Contact information:   [address] [facility] [place] [address] [phone]        The results of significant diagnostics from this hospitalization (including imaging, microbiology, ancillary and laboratory) are listed below for reference.    Significant Diagnostic Studies: Ct Abdomen Pelvis W Contrast  07/24/2013   *RADIOLOGY REPORT*  Clinical Data: Epigastric and left upper  quadrant pain radiating to the back.  Nausea, vomiting, and diarrhea starting at 05:00 p.m. today.  History of kidney transplant.  CT ABDOMEN AND PELVIS WITH CONTRAST  Technique:  Multidetector CT imaging of the abdomen and pelvis was performed following the standard protocol during bolus administration of intravenous contrast.  Contrast: 80mL OMNIPAQUE IOHEXOL 300 MG/ML  SOLN  Comparison: 09/12/2011  Findings: Small focal area of atelectasis or infiltration in the left lung base.  Mild diffuse fatty infiltration of the liver.  Surgical absence of the gallbladder.  Local bile duct dilatation and the posterior segment right lobe, stable since previous study.  No enhancing mass lesion is appreciated.  The pancreas, spleen, adrenal glands, abdominal aorta, and inferior vena cava are unremarkable.  No retroperitoneal lymphadenopathy.  Marked atrophy of the native kidneys.  The stomach is normal.  Fluid-filled small bowel with suggestion of diffuse small bowel wall thickening.  This could represent small bowel edema or enteritis.  No free air or free fluid in the abdomen.  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  No hydronephrosis.  The uterus and ovaries are not enlarged.  Fluid throughout the colon consistent with history of diarrhea.  No definite colonic wall thickening.  No free or loculated pelvic fluid collections.  Appendix is not identified. Normal alignment of the lumbar vertebrae.  No destructive bone lesions appreciated.  IMPRESSION: Fluid-filled thick walled small bowel without distension suggesting bowel edema or enteritis.  Fluid filled colon consistent with history of diarrhea.  Atrophy of the native kidneys.  Normal appearing right pelvic transplant kidney.   Original Report Authenticated By: Burman Nieves, M.D.    Microbiology: No results found for this or any previous visit (from the past 240 hour(s)).   Labs: Basic Metabolic Panel: No results  found for this basename: NA, K, CL, CO2, GLUCOSE, BUN, CREATININE, CALCIUM, MG, PHOS,  in the last 168 hours Liver Function Tests: No results found for this basename: AST, ALT, ALKPHOS, BILITOT, PROT, ALBUMIN,  in the last 168 hours No results found for this basename: LIPASE, AMYLASE,  in the last 168 hours No results found for this basename: AMMONIA,  in the last 168 hours CBC: No results found for this basename: WBC, NEUTROABS, HGB, HCT, MCV, PLT,  in the last 168 hours Cardiac Enzymes: No results found for this basename: CKTOTAL, CKMB, CKMBINDEX, TROPONINI,  in the last 168 hours BNP: BNP (last 3 results) No results found for this basename: PROBNP,  in the last 8760 hours CBG: No results found for this basename: GLUCAP,  in the last 168 hours     Signed:  Rhetta Mura  [facility] 08/19/2013, 2:34 PM

## 2014-04-22 ENCOUNTER — Ambulatory Visit
Admission: RE | Admit: 2014-04-22 | Discharge: 2014-04-22 | Disposition: A | Discharge status: Home / Self Care | Payer: Self-pay | Source: Ambulatory Visit

## 2014-04-22 ENCOUNTER — Encounter (INDEPENDENT_AMBULATORY_CARE_PROVIDER_SITE_OTHER): Payer: Self-pay

## 2014-04-22 ENCOUNTER — Other Ambulatory Visit: Payer: Self-pay

## 2014-04-22 DIAGNOSIS — Z1231 Encounter for screening mammogram for malignant neoplasm of breast: Secondary | ICD-10-CM

## 2014-07-30 ENCOUNTER — Other Ambulatory Visit: Payer: Self-pay | Admitting: Internal Medicine

## 2014-07-30 ENCOUNTER — Ambulatory Visit
Admission: RE | Admit: 2014-07-30 | Discharge: 2014-07-30 | Disposition: A | Discharge status: Home / Self Care | Payer: No Typology Code available for payment source | Source: Ambulatory Visit | Attending: Internal Medicine | Admitting: Internal Medicine

## 2014-07-30 DIAGNOSIS — R059 Cough, unspecified: Secondary | ICD-10-CM

## 2014-07-30 DIAGNOSIS — R05 Cough: Secondary | ICD-10-CM

## 2014-09-07 ENCOUNTER — Other Ambulatory Visit: Payer: Self-pay | Admitting: Nephrology

## 2014-09-07 DIAGNOSIS — N6310 Unspecified lump in the right breast, unspecified quadrant: Secondary | ICD-10-CM

## 2014-09-16 ENCOUNTER — Ambulatory Visit
Admission: RE | Admit: 2014-09-16 | Discharge: 2014-09-16 | Disposition: A | Discharge status: Home / Self Care | Payer: No Typology Code available for payment source | Source: Ambulatory Visit | Attending: Nephrology | Admitting: Nephrology

## 2014-09-16 ENCOUNTER — Other Ambulatory Visit: Payer: Self-pay | Admitting: Nephrology

## 2014-09-16 DIAGNOSIS — N6310 Unspecified lump in the right breast, unspecified quadrant: Secondary | ICD-10-CM

## 2014-10-07 ENCOUNTER — Other Ambulatory Visit: Payer: Self-pay | Admitting: Nephrology

## 2014-10-07 ENCOUNTER — Ambulatory Visit
Admission: RE | Admit: 2014-10-07 | Discharge: 2014-10-07 | Disposition: A | Discharge status: Home / Self Care | Payer: No Typology Code available for payment source | Source: Ambulatory Visit | Attending: Nephrology | Admitting: Nephrology

## 2014-10-07 DIAGNOSIS — N6310 Unspecified lump in the right breast, unspecified quadrant: Secondary | ICD-10-CM

## 2015-03-12 ENCOUNTER — Other Ambulatory Visit: Payer: Self-pay

## 2015-03-12 DIAGNOSIS — Z1231 Encounter for screening mammogram for malignant neoplasm of breast: Secondary | ICD-10-CM

## 2015-04-26 ENCOUNTER — Ambulatory Visit
Admission: RE | Admit: 2015-04-26 | Discharge: 2015-04-26 | Disposition: A | Discharge status: Home / Self Care | Payer: No Typology Code available for payment source | Source: Ambulatory Visit

## 2015-04-26 DIAGNOSIS — Z1231 Encounter for screening mammogram for malignant neoplasm of breast: Secondary | ICD-10-CM

## 2016-03-29 ENCOUNTER — Other Ambulatory Visit: Payer: Self-pay | Admitting: Nephrology

## 2016-03-29 ENCOUNTER — Other Ambulatory Visit: Payer: Self-pay

## 2016-03-29 DIAGNOSIS — Z1231 Encounter for screening mammogram for malignant neoplasm of breast: Secondary | ICD-10-CM

## 2016-03-30 ENCOUNTER — Ambulatory Visit
Admission: RE | Admit: 2016-03-30 | Discharge: 2016-03-30 | Disposition: A | Discharge status: Home / Self Care | Payer: No Typology Code available for payment source | Source: Ambulatory Visit

## 2016-03-30 DIAGNOSIS — Z1231 Encounter for screening mammogram for malignant neoplasm of breast: Secondary | ICD-10-CM

## 2016-07-23 ENCOUNTER — Inpatient Hospital Stay (HOSPITAL_COMMUNITY): Payer: Self-pay

## 2016-07-23 ENCOUNTER — Inpatient Hospital Stay (HOSPITAL_COMMUNITY)
Admission: EM | Admit: 2016-07-23 | Discharge: 2016-07-24 | DRG: 392 | Disposition: A | Discharge status: Home / Self Care | Payer: Self-pay | Attending: Internal Medicine | Admitting: Internal Medicine

## 2016-07-23 ENCOUNTER — Emergency Department (HOSPITAL_COMMUNITY): Payer: Self-pay

## 2016-07-23 ENCOUNTER — Encounter (HOSPITAL_COMMUNITY): Payer: Self-pay

## 2016-07-23 DIAGNOSIS — Z940 Kidney transplant status: Secondary | ICD-10-CM

## 2016-07-23 DIAGNOSIS — R1013 Epigastric pain: Secondary | ICD-10-CM | POA: Diagnosis present

## 2016-07-23 DIAGNOSIS — Z79899 Other long term (current) drug therapy: Secondary | ICD-10-CM

## 2016-07-23 DIAGNOSIS — Z9225 Personal history of immunosupression therapy: Secondary | ICD-10-CM

## 2016-07-23 DIAGNOSIS — R1011 Right upper quadrant pain: Principal | ICD-10-CM | POA: Diagnosis present

## 2016-07-23 DIAGNOSIS — R6510 Systemic inflammatory response syndrome (SIRS) of non-infectious origin without acute organ dysfunction: Secondary | ICD-10-CM | POA: Diagnosis present

## 2016-07-23 DIAGNOSIS — D899 Disorder involving the immune mechanism, unspecified: Secondary | ICD-10-CM

## 2016-07-23 DIAGNOSIS — R109 Unspecified abdominal pain: Secondary | ICD-10-CM | POA: Diagnosis present

## 2016-07-23 DIAGNOSIS — R52 Pain, unspecified: Secondary | ICD-10-CM

## 2016-07-23 DIAGNOSIS — E2740 Unspecified adrenocortical insufficiency: Secondary | ICD-10-CM | POA: Diagnosis present

## 2016-07-23 DIAGNOSIS — Z7952 Long term (current) use of systemic steroids: Secondary | ICD-10-CM

## 2016-07-23 DIAGNOSIS — N183 Chronic kidney disease, stage 3 (moderate): Secondary | ICD-10-CM | POA: Diagnosis present

## 2016-07-23 DIAGNOSIS — D849 Immunodeficiency, unspecified: Secondary | ICD-10-CM | POA: Diagnosis present

## 2016-07-23 DIAGNOSIS — R509 Fever, unspecified: Secondary | ICD-10-CM

## 2016-07-23 LAB — COMPREHENSIVE METABOLIC PANEL
ALBUMIN: 3.4 g/dL — AB (ref 3.5–5.0)
ALT: 18 U/L (ref 14–54)
AST: 25 U/L (ref 15–41)
Alkaline Phosphatase: 79 U/L (ref 38–126)
Anion gap: 8 (ref 5–15)
BUN: 17 mg/dL (ref 6–20)
CALCIUM: 8.6 mg/dL — AB (ref 8.9–10.3)
CO2: 23 mmol/L (ref 22–32)
CREATININE: 1.26 mg/dL — AB (ref 0.44–1.00)
Chloride: 102 mmol/L (ref 101–111)
GFR calc Af Amer: >60 mL/min (ref >60)
GFR calc non Af Amer: 53 mL/min — ABNORMAL LOW (ref >60)
Glucose, Bld: 86 mg/dL (ref 65–99)
Potassium: 3.7 mmol/L (ref 3.5–5.1)
SODIUM: 133 mmol/L — AB (ref 135–145)
Total Bilirubin: 0.8 mg/dL (ref 0.3–1.2)
Total Protein: 6.6 g/dL (ref 6.5–8.1)

## 2016-07-23 LAB — URINALYSIS, ROUTINE W REFLEX MICROSCOPIC
Bilirubin Urine: NEGATIVE
GLUCOSE, UA: NEGATIVE mg/dL
Ketones, ur: NEGATIVE mg/dL
LEUKOCYTES UA: NEGATIVE
Nitrite: NEGATIVE
PROTEIN: NEGATIVE mg/dL
Specific Gravity, Urine: 1.015 (ref 1.005–1.030)
pH: 7 (ref 5.0–8.0)

## 2016-07-23 LAB — I-STAT CG4 LACTIC ACID, ED: Lactic Acid, Venous: 1.04 mmol/L (ref 0.5–1.9)

## 2016-07-23 LAB — CBC WITH DIFFERENTIAL/PLATELET
BASOS ABS: 0 10*3/uL (ref 0.0–0.1)
BASOS PCT: 0 %
EOS ABS: 0 10*3/uL (ref 0.0–0.7)
EOS PCT: 0 %
HCT: 38.3 % (ref 36.0–46.0)
Hemoglobin: 12.3 g/dL (ref 12.0–15.0)
LYMPHS PCT: 11 %
Lymphs Abs: 0.9 10*3/uL (ref 0.7–4.0)
MCH: 26.2 pg (ref 26.0–34.0)
MCHC: 32.1 g/dL (ref 30.0–36.0)
MCV: 81.7 fL (ref 78.0–100.0)
Monocytes Absolute: 0.9 10*3/uL (ref 0.1–1.0)
Monocytes Relative: 11 %
Neutro Abs: 6.3 10*3/uL (ref 1.7–7.7)
Neutrophils Relative %: 78 %
PLATELETS: 236 10*3/uL (ref 150–400)
RBC: 4.69 MIL/uL (ref 3.87–5.11)
RDW: 13.9 % (ref 11.5–15.5)
WBC: 8.1 10*3/uL (ref 4.0–10.5)

## 2016-07-23 LAB — URINE MICROSCOPIC-ADD ON

## 2016-07-23 LAB — LIPASE, BLOOD: Lipase: 26 U/L (ref 11–51)

## 2016-07-23 LAB — RAPID STREP SCREEN (MED CTR MEBANE ONLY): Streptococcus, Group A Screen (Direct): NEGATIVE

## 2016-07-23 MED ORDER — PANTOPRAZOLE SODIUM 40 MG PO TBEC
40.0000 mg | DELAYED_RELEASE_TABLET | Freq: Two times a day (BID) | ORAL | Status: DC
Start: 2016-07-23 — End: 2016-07-24
  Administered 2016-07-23 – 2016-07-24 (×2): 40 mg via ORAL
  Filled 2016-07-23 (×2): qty 1

## 2016-07-23 MED ORDER — SODIUM CHLORIDE 0.9 % IV BOLUS (SEPSIS)
250.0000 mL | Freq: Once | INTRAVENOUS | Status: AC
  Administered 2016-07-23: 250 mL via INTRAVENOUS

## 2016-07-23 MED ORDER — VANCOMYCIN HCL IN DEXTROSE 1-5 GM/200ML-% IV SOLN
1000.0000 mg | Freq: Once | INTRAVENOUS | Status: AC
  Administered 2016-07-23: 1000 mg via INTRAVENOUS
  Filled 2016-07-23: qty 200

## 2016-07-23 MED ORDER — ACETAMINOPHEN 650 MG RE SUPP: 650.0000 mg | Freq: Four times a day (QID) | RECTAL | Status: DC | PRN

## 2016-07-23 MED ORDER — GI COCKTAIL ~~LOC~~
30.0000 mL | Freq: Once | ORAL | Status: AC
  Administered 2016-07-23: 30 mL via ORAL
  Filled 2016-07-23: qty 30

## 2016-07-23 MED ORDER — PIPERACILLIN-TAZOBACTAM 3.375 G IVPB 30 MIN
3.3750 g | Freq: Once | INTRAVENOUS | Status: AC
  Administered 2016-07-23: 3.375 g via INTRAVENOUS
  Filled 2016-07-23: qty 50

## 2016-07-23 MED ORDER — ONDANSETRON HCL 4 MG/2ML IJ SOLN: 4.0000 mg | Freq: Four times a day (QID) | INTRAMUSCULAR | Status: DC | PRN

## 2016-07-23 MED ORDER — OXYCODONE HCL 5 MG PO TABS: 5.0000 mg | ORAL_TABLET | ORAL | Status: DC | PRN

## 2016-07-23 MED ORDER — SODIUM CHLORIDE 0.9 % IV SOLN
INTRAVENOUS | Status: AC
  Administered 2016-07-23: 75 mL/h via INTRAVENOUS
  Administered 2016-07-23: 15:00:00 via INTRAVENOUS

## 2016-07-23 MED ORDER — SODIUM CHLORIDE 0.9 % IV BOLUS (SEPSIS)
500.0000 mL | Freq: Once | INTRAVENOUS | Status: AC
  Administered 2016-07-23: 500 mL via INTRAVENOUS

## 2016-07-23 MED ORDER — ONDANSETRON HCL 4 MG PO TABS: 4.0000 mg | ORAL_TABLET | Freq: Four times a day (QID) | ORAL | Status: DC | PRN

## 2016-07-23 MED ORDER — ENOXAPARIN SODIUM 40 MG/0.4ML ~~LOC~~ SOLN
40.0000 mg | SUBCUTANEOUS | Status: DC
  Administered 2016-07-23 – 2016-07-24 (×2): 40 mg via SUBCUTANEOUS
  Filled 2016-07-23 (×3): qty 0.4

## 2016-07-23 MED ORDER — BISACODYL 5 MG PO TBEC
5.0000 mg | DELAYED_RELEASE_TABLET | Freq: Every day | ORAL | Status: DC | PRN
  Filled 2016-07-23: qty 1

## 2016-07-23 MED ORDER — PIPERACILLIN-TAZOBACTAM 3.375 G IVPB
3.3750 g | Freq: Three times a day (TID) | INTRAVENOUS | Status: DC
  Administered 2016-07-23 – 2016-07-24 (×3): 3.375 g via INTRAVENOUS
  Filled 2016-07-23 (×6): qty 50

## 2016-07-23 MED ORDER — PREDNISONE 20 MG PO TABS
40.0000 mg | ORAL_TABLET | Freq: Every day | ORAL | Status: DC
  Administered 2016-07-23 – 2016-07-24 (×2): 40 mg via ORAL
  Filled 2016-07-23 (×2): qty 2

## 2016-07-23 MED ORDER — DIPHENHYDRAMINE HCL 25 MG PO CAPS
25.0000 mg | ORAL_CAPSULE | Freq: Four times a day (QID) | ORAL | Status: DC | PRN
  Administered 2016-07-23: 25 mg via ORAL
  Filled 2016-07-23: qty 1

## 2016-07-23 MED ORDER — VANCOMYCIN HCL IN DEXTROSE 750-5 MG/150ML-% IV SOLN
750.0000 mg | Freq: Two times a day (BID) | INTRAVENOUS | Status: DC
  Administered 2016-07-23 – 2016-07-24 (×2): 750 mg via INTRAVENOUS
  Filled 2016-07-23 (×3): qty 150

## 2016-07-23 MED ORDER — SODIUM CHLORIDE 0.9 % IV BOLUS (SEPSIS)
1000.0000 mL | Freq: Once | INTRAVENOUS | Status: AC
  Administered 2016-07-23: 1000 mL via INTRAVENOUS

## 2016-07-23 MED ORDER — ACETAMINOPHEN 325 MG PO TABS
650.0000 mg | ORAL_TABLET | Freq: Four times a day (QID) | ORAL | Status: DC | PRN
  Administered 2016-07-23: 650 mg via ORAL
  Filled 2016-07-23: qty 2

## 2016-07-23 NOTE — ED Notes (Signed)
MD at bedside. 

## 2016-07-23 NOTE — ED Triage Notes (Signed)
Pt arrives ambulatory with c/o right upper abdominal pain which intermittently radiates to right back. States started feeling bad 2 days ago and fever yesterday. Hx of right kidney transplant 8 years ago,

## 2016-07-23 NOTE — ED Notes (Signed)
Pt state itching decreased and redness noted at scalp seem tyo be fading. Pt to [place]

## 2016-07-23 NOTE — Progress Notes (Signed)
Pharmacy Antibiotic Note  Sarah PilonVictoria Dyer is a 40 y.o. female admitted on 07/23/2016 with abdominal pain in the setting of kidney transplant, concern for sepsis.  Pharmacy has been consulted for Vancomycin and Zosyn dosing.  Vancomycin 1gm and Zosyn 3.375gm IV x 1 ordered in ED.  Plan: Vancomycin 1gm IV x 1 (ordered in ED) Followed by Vancomycin 750mg  IV q12h Zosyn 3.375 gm IV q8h (4 hour infusion) Follow-up weight upon arrival to floor Follow-up cx data, renal function, and clinical progress     Temp (24hrs), Avg:101 F (38.3 C), Min:100.7 F (38.2 C), Max:101.3 F (38.5 C)   Recent Labs Lab 07/23/16 0815 07/23/16 0827  WBC 8.1  --   CREATININE 1.26*  --   LATICACIDVEN  --  1.04    CrCl cannot be calculated (Unknown ideal weight.).    No Known Allergies  Antimicrobials this admission: Vanc 8/20 >> Zosyn 8/20 >>  Dose adjustments this admission: n/a  Microbiology results: 8/20 Urine cx >   Thank you for allowing pharmacy to be a part of this patient's care.  Toys 'R' UsKimberly Pricila Bridge, Pharm.D., BCPS Clinical Pharmacist Pager [phone] 07/23/2016 9:39 AM

## 2016-07-23 NOTE — ED Notes (Signed)
Patient transported to X-ray 

## 2016-07-23 NOTE — H&P (Signed)
History and Physical    Sarah Dyer ZOX:[id number] DOB: July 26, 1976 DOA: 07/23/2016  PCP: Trevor Iha, MD  Outpatient Specialists: Nephrology, Dr. Darrick Penna Patient coming from: home  Chief Complaint: abdominal pain and fever  HPI: Sarah Dyer is a 40 y.o. female with medical history significant of end-stage renal disease status post renal transplant 8 years ago in [place], followed by Dr. Darrick Penna as an outpatient, on chronic immune suppression, presents to the emergency room with chief complaint of fever and chills, as well as abdominal pain for the past day. Patient experienced right upper quadrant abdominal pain, and this migrated into the epigastric area and into her back. She denies any nausea or vomiting. She has no constipation or diarrhea. She experienced similar symptoms in the past and at that time her gallbladder needed to be removed for what sounds like gallstones. She is generally healthy and has not required any hospitalizations in the last several years. She doesn't smoke or drink. She has no chest pain, no shortness of breath, no cough or chest congestion. She denies any palpitations. In the emergency room, patient was found to be febrile to 101.3, mildly tachycardic to the mid 90s, she has no white count, lactic acid is normal. She has mild elevation of her creatinine of 1.2. Urinalysis is negative. She was started on antibiotics with vancomycin and Zosyn, cultures were obtained and [facility] was asked for admission for SIRS without clear infectious cause.  Review of Systems: As per HPI otherwise 10 point review of systems negative.   Past Medical History:  Diagnosis Date  . Chronic kidney disease   . Gastritis     Past Surgical History:  Procedure Laterality Date  . CHOLECYSTECTOMY    . KIDNEY TRANSPLANT  January2009  . TUBAL LIGATION       reports that she has never smoked. She has never used smokeless tobacco. She reports that she does not drink  alcohol. Her drug history is not on file.  No Known Allergies  Family history positive for DM in her father   Prior to Admission medications   Medication Sig Start Date End Date Taking? Authorizing Provider  acetaminophen (TYLENOL) 500 MG tablet Take 500 mg by mouth every 6 (six) hours as needed for pain.   Yes Historical Provider, MD  cycloSPORINE (SANDIMMUNE) 100 MG/ML solution Take 90 mg by mouth 2 (two) times daily.   Yes Historical Provider, MD  mycophenolate (CELLCEPT) 500 MG tablet Take 500 mg by mouth 3 (three) times daily.   Yes Historical Provider, MD  omeprazole (PRILOSEC) 20 MG capsule Take 2 capsules (40 mg total) by mouth 2 (two) times daily. 08/01/13  Yes Leroy Sea, MD  predniSONE (DELTASONE) 5 MG tablet Take 5 mg by mouth daily.   Yes Historical Provider, MD    Physical Exam: Vitals:   07/23/16 0724 07/23/16 0921  BP: 132/71 111/70  Pulse: 90 81  Resp: 18 16  Temp: 100.7 F (38.2 C) 101.3 F (38.5 C)  TempSrc:  Oral  SpO2: 99% 98%    Constitutional: NAD, calm, comfortable Vitals:   07/23/16 0724 07/23/16 0921  BP: 132/71 111/70  Pulse: 90 81  Resp: 18 16  Temp: 100.7 F (38.2 C) 101.3 F (38.5 C)  TempSrc:  Oral  SpO2: 99% 98%   Eyes: PERRL, lids and conjunctivae normal ENMT: Mucous membranes are moist. Posterior pharynx clear of any exudate or lesions.  Neck: normal, supple, no masses, no thyromegaly Respiratory: clear to auscultation bilaterally, no wheezing, no  crackles. Normal respiratory effort. No accessory muscle use.  Cardiovascular: Regular rate and rhythm, no murmurs / rubs / gallops. No extremity edema. 2+ pedal pulses. No carotid bruits.  Abdomen: RUQ tenderness, no guarding / rebound Musculoskeletal: no clubbing / cyanosis. No joint deformity upper and lower extremities. Good ROM, no contractures. Normal muscle tone.  Skin: no rashes, lesions, ulcers. No induration Neurologic: non focal  Psychiatric: Normal judgment and insight.  Alert and oriented x 3. Normal mood.   Labs on Admission: I have personally reviewed following labs and imaging studies  CBC:  Recent Labs Lab 07/23/16 0815  WBC 8.1  NEUTROABS 6.3  HGB 12.3  HCT 38.3  MCV 81.7  PLT 236   Basic Metabolic Panel:  Recent Labs Lab 07/23/16 0815  NA 133*  K 3.7  CL 102  CO2 23  GLUCOSE 86  BUN 17  CREATININE 1.26*  CALCIUM 8.6*   GFR: CrCl cannot be calculated (Unknown ideal weight.). Liver Function Tests:  Recent Labs Lab 07/23/16 0815  AST 25  ALT 18  ALKPHOS 79  BILITOT 0.8  PROT 6.6  ALBUMIN 3.4*    Recent Labs Lab 07/23/16 0815  LIPASE 26   No results for input(s): AMMONIA in the last 168 hours. Coagulation Profile: No results for input(s): INR, PROTIME in the last 168 hours. Cardiac Enzymes: No results for input(s): CKTOTAL, CKMB, CKMBINDEX, TROPONINI in the last 168 hours. BNP (last 3 results) No results for input(s): PROBNP in the last 8760 hours. HbA1C: No results for input(s): HGBA1C in the last 72 hours. CBG: No results for input(s): GLUCAP in the last 168 hours. Lipid Profile: No results for input(s): CHOL, HDL, LDLCALC, TRIG, CHOLHDL, LDLDIRECT in the last 72 hours. Thyroid Function Tests: No results for input(s): TSH, T4TOTAL, FREET4, T3FREE, THYROIDAB in the last 72 hours. Anemia Panel: No results for input(s): VITAMINB12, FOLATE, FERRITIN, TIBC, IRON, RETICCTPCT in the last 72 hours. Urine analysis:    Component Value Date/Time   COLORURINE YELLOW 07/23/2016 0735   APPEARANCEUR CLEAR 07/23/2016 0735   LABSPEC 1.015 07/23/2016 0735   PHURINE 7.0 07/23/2016 0735   GLUCOSEU NEGATIVE 07/23/2016 0735   HGBUR SMALL (A) 07/23/2016 0735   BILIRUBINUR NEGATIVE 07/23/2016 0735   KETONESUR NEGATIVE 07/23/2016 0735   PROTEINUR NEGATIVE 07/23/2016 0735   UROBILINOGEN 0.2 07/31/2013 0057   NITRITE NEGATIVE 07/23/2016 0735   LEUKOCYTESUR NEGATIVE 07/23/2016 0735   Sepsis  Labs: @LABRCNTIP (procalcitonin:4,lacticidven:4) )No results found for this or any previous visit (from the past 240 hour(s)).   Radiological Exams on Admission: Dg Chest 2 View  Result Date: 07/23/2016 CLINICAL DATA:  Substernal and right lower chest pain. EXAM: CHEST  2 VIEW COMPARISON:  07/30/2014 FINDINGS: The heart size and mediastinal contours are within normal limits. There is no evidence of pulmonary edema, consolidation, pneumothorax, nodule or pleural fluid. The visualized skeletal structures are unremarkable. IMPRESSION: No active cardiopulmonary disease. Electronically Signed   By: Irish LackGlenn  Yamagata M.D.   On: 07/23/2016 08:19    Assessment/Plan Active Problems:   Abdominal pain   History of kidney transplant   Epigastric pain   SIRS (systemic inflammatory response syndrome) (HCC)    SIRS - no clear cause, chest x-ray is normal and patient has no respiratory symptoms, UA is unremarkable. - She has right upper quadrant pain, I'll obtain a stat ultrasound to evaluate, LFTs are normal however need to be monitored, will repeat tomorrow morning.  ESRD s/p renal transplantation / CKD stage II - III now -  Cr mildly elevated at 1.2, likely at baseline, monitor - hold Cellcept and Cyclosporine for now, resume when SIRS improve, increase Prednisone dose to avoid hypotension / adrenal insufficiency  Abdominal pain - as above, monitor LFTs and check abdominal US   DVT prophylaxis: Lovenox  Code Status: Full  Family Communication: d/w daughter bedside Disposition Plan: admit to Medsurg Consults called: none  Admission status: inpatient    Pamella Pertostin Gherghe, MD [facility] Pager [phone]  If 7PM-7AM, please contact night-coverage [email or web address] Password [email or web address]  07/23/2016, 10:27 AM

## 2016-07-23 NOTE — ED Provider Notes (Signed)
[facility]-EMERGENCY DEPT Provider Note   CSN: [id number] Arrival date & time: 07/23/16  [id number]     History   Chief Complaint Chief Complaint  Patient presents with  . Abdominal Pain  . Fever    HPI Sarah PilonVictoria Dyer is a 40 y.o. female.  HPI  Pt speaks spanish and english, family translating at bedside when necessary  Abdominal pain and fever/chills beginning yesterday AM.  Abdominal pain is sharp, RUQ, radiating to back, constant, nothing makes it better or worse but it is waxing/waning.  Fever and chills. Mild scratchy hoarse throat starting this AM. No known sick contacts. No cough, no congestion, no rash, no vaginal discharge, no lower abdominal pain. Hx of transplant and cholecystectomy.  Past Medical History:  Diagnosis Date  . Chronic kidney disease   . Gastritis     Patient Active Problem List   Diagnosis Date Noted  . SIRS (systemic inflammatory response syndrome) (HCC) 07/23/2016  . Epigastric pain 07/31/2013  . Abnormal ECG 07/31/2013  . Abdominal pain 07/24/2013  . Nausea & vomiting 07/24/2013  . Gastroenteritis 07/24/2013  . History of kidney transplant 07/24/2013  . RENAL INSUFFICIENCY, CHRONIC 01/31/2007    Past Surgical History:  Procedure Laterality Date  . CHOLECYSTECTOMY    . KIDNEY TRANSPLANT  January2009  . TUBAL LIGATION      OB History    No data available       Home Medications    Prior to Admission medications   Medication Sig Start Date End Date Taking? Authorizing Provider  acetaminophen (TYLENOL) 500 MG tablet Take 500 mg by mouth every 6 (six) hours as needed for pain.   Yes Historical Provider, MD  cycloSPORINE (SANDIMMUNE) 100 MG/ML solution Take 90 mg by mouth 2 (two) times daily.   Yes Historical Provider, MD  mycophenolate (CELLCEPT) 500 MG tablet Take 500 mg by mouth 3 (three) times daily.   Yes Historical Provider, MD  omeprazole (PRILOSEC) 20 MG capsule Take 2 capsules (40 mg total) by mouth 2 (two) times daily. 08/01/13   Yes Leroy SeaPrashant K Singh, MD  predniSONE (DELTASONE) 5 MG tablet Take 5 mg by mouth daily.   Yes Historical Provider, MD    Family History No family history on file.  Social History Social History  Substance Use Topics  . Smoking status: Never Smoker  . Smokeless tobacco: Never Used  . Alcohol use No     Allergies   Review of patient's allergies indicates no known allergies.   Review of Systems Review of Systems  Constitutional: Negative for fever.  HENT: Positive for sore throat (mild scratch).   Eyes: Negative for visual disturbance.  Respiratory: Negative for cough and shortness of breath.   Cardiovascular: Negative for chest pain.  Gastrointestinal: Positive for abdominal pain and nausea. Negative for constipation, diarrhea and vomiting.  Genitourinary: Negative for difficulty urinating and dysuria.  Musculoskeletal: Negative for back pain and neck pain.  Skin: Negative for rash.  Neurological: Negative for syncope and headaches.     Physical Exam Updated Vital Signs BP (!) 105/53 (BP Location: Right Arm)   Pulse 86   Temp 100 F (37.8 C) (Oral)   Resp 20   Ht 5' (1.524 m)   Wt 135 lb (61.2 kg)   LMP 07/23/2016   SpO2 98%   BMI 26.37 kg/m   Physical Exam  Constitutional: She is oriented to person, place, and time. She appears well-developed and well-nourished. No distress.  HENT:  Head: Normocephalic and atraumatic.  Eyes:  Conjunctivae and EOM are normal.  Neck: Normal range of motion.  Cardiovascular: Normal rate, regular rhythm, normal heart sounds and intact distal pulses.  Exam reveals no gallop and no friction rub.   No murmur heard. Pulmonary/Chest: Effort normal and breath sounds normal. No respiratory distress. She has no wheezes. She has no rales.  Abdominal: Soft. She exhibits no distension. There is tenderness (RUQ, epigastric). There is no guarding.  Musculoskeletal: She exhibits no edema or tenderness.  Neurological: She is alert and oriented  to person, place, and time.  Skin: Skin is warm and dry. No rash noted. She is not diaphoretic. No erythema.  Nursing note and vitals reviewed.    ED Treatments / Results  Labs (all labs ordered are listed, but only abnormal results are displayed) Labs Reviewed  COMPREHENSIVE METABOLIC PANEL - Abnormal; Notable for the following:       Result Value   Sodium 133 (*)    Creatinine, Ser 1.26 (*)    Calcium 8.6 (*)    Albumin 3.4 (*)    GFR calc non Af Amer 53 (*)    All other components within normal limits  URINALYSIS, ROUTINE W REFLEX MICROSCOPIC (NOT AT [facility]) - Abnormal; Notable for the following:    Hgb urine dipstick SMALL (*)    All other components within normal limits  URINE MICROSCOPIC-ADD ON - Abnormal; Notable for the following:    Squamous Epithelial / LPF 0-5 (*)    Bacteria, UA RARE (*)    All other components within normal limits  RAPID STREP SCREEN (NOT AT [facility])  CULTURE, BLOOD (ROUTINE X 2)  CULTURE, BLOOD (ROUTINE X 2)  URINE CULTURE  CULTURE, GROUP A STREP ([facility])  CBC WITH DIFFERENTIAL/PLATELET  LIPASE, BLOOD  COMPREHENSIVE METABOLIC PANEL  CBC  I-STAT CG4 LACTIC ACID, ED  I-STAT CG4 LACTIC ACID, ED    EKG  EKG Interpretation  Date/Time:  Sunday July 23 2016 09:27:49 EDT Ventricular Rate:  89 PR Interval:    QRS Duration: 88 QT Interval:  353 QTC Calculation: 430 R Axis:   56 Text Interpretation:  Sinus rhythm Nonspecific T abnormalities, diffuse leads Similar to 07/31/2013, however changed since 8/29 In comparison to 8/28, no significant changes noted, rate slower, however in comparison to most recent, ST changes new Confirmed by [facility] MD,  ([id number]) on 07/23/2016 9:04:02 PM       Radiology Dg Chest 2 View  Result Date: 07/23/2016 CLINICAL DATA:  Substernal and right lower chest pain. EXAM: CHEST  2 VIEW COMPARISON:  07/30/2014 FINDINGS: The heart size and mediastinal contours are within normal limits. There is no evidence of pulmonary  edema, consolidation, pneumothorax, nodule or pleural fluid. The visualized skeletal structures are unremarkable. IMPRESSION: No active cardiopulmonary disease. Electronically Signed   By: Irish Lack M.D.   On: 07/23/2016 08:19   Abd 1 View (kub)  Result Date: 07/23/2016 CLINICAL DATA:  Right lower quadrant pain EXAM: ABDOMEN - 1 VIEW COMPARISON:  July 31, 2013 FINDINGS: There is fairly diffuse stool throughout the colon. There is no bowel dilatation or air-fluid level to suggest obstruction. No free air. There are surgical clips in the right upper quadrant region. IMPRESSION: No bowel obstruction or free air evident. Fairly diffuse stool throughout colon. Electronically Signed   By: Bretta Bang III M.D.   On: 07/23/2016 11:00   US Abdomen Complete  Result Date: 07/23/2016 CLINICAL DATA:  Right upper quadrant pains yesterday morning. Previous cholecystectomy. Previous kidney transplant 2009 for chronic  renal disease. EXAM: ABDOMEN ULTRASOUND COMPLETE COMPARISON:  CT 07/24/2013. FINDINGS: Gallbladder: Surgically absent. Common bile duct: Diameter: 5 mm, normal following cholecystectomy. Liver: No focal lesion identified. Within normal limits in parenchymal echogenicity. IVC: No abnormality visualized. Pancreas: Visualized portion unremarkable. Spleen: Size and appearance within normal limits. Right Kidney: Length: The native kidney is markedly atrophic. Left Kidney: Length: Native kidney is markedly atrophic. The transplant kidney was not examined. Abdominal aorta: No aneurysm visualized. Other findings: No ascites IMPRESSION: Atrophic native kidneys.  The transplant kidney was not studied. No evidence of hepatobiliary pathology. No cause of right upper quadrant pain identified. Electronically Signed   By: Paulina FusiMark  Shogry M.D.   On: 07/23/2016 12:47    Procedures Procedures (including critical care time)  Medications Ordered in ED Medications  vancomycin (VANCOCIN) IVPB 750 mg/150 ml premix  (not administered)  piperacillin-tazobactam (ZOSYN) IVPB 3.375 g (3.375 g Intravenous Given 07/23/16 1441)  enoxaparin (LOVENOX) injection 40 mg (40 mg Subcutaneous Given 07/23/16 1655)  0.9 %  sodium chloride infusion ( Intravenous New Bag/Given 07/23/16 1440)  oxyCODONE (Oxy IR/ROXICODONE) immediate release tablet 5 mg (not administered)  acetaminophen (TYLENOL) tablet 650 mg (650 mg Oral Given 07/23/16 1103)    Or  acetaminophen (TYLENOL) suppository 650 mg ( Rectal See Alternative 07/23/16 1103)  ondansetron (ZOFRAN) tablet 4 mg (not administered)    Or  ondansetron (ZOFRAN) injection 4 mg (not administered)  bisacodyl (DULCOLAX) EC tablet 5 mg (not administered)  pantoprazole (PROTONIX) EC tablet 40 mg (40 mg Oral Given 07/23/16 1655)  predniSONE (DELTASONE) tablet 40 mg (40 mg Oral Given 07/23/16 1112)  diphenhydrAMINE (BENADRYL) capsule 25 mg (25 mg Oral Given 07/23/16 1118)  sodium chloride 0.9 % bolus 1,000 mL (0 mLs Intravenous Stopped 07/23/16 0935)    And  sodium chloride 0.9 % bolus 500 mL (0 mLs Intravenous Stopped 07/23/16 1040)    And  sodium chloride 0.9 % bolus 250 mL (0 mLs Intravenous Stopped 07/23/16 1040)  piperacillin-tazobactam (ZOSYN) IVPB 3.375 g (0 g Intravenous Stopped 07/23/16 1000)  vancomycin (VANCOCIN) IVPB 1000 mg/200 mL premix (0 mg Intravenous Stopped 07/23/16 1113)  gi cocktail (Maalox,Lidocaine,Donnatal) (30 mLs Oral Given 07/23/16 0929)     Initial Impression / Assessment and Plan / ED Course  I have reviewed the triage vital signs and the nursing notes.  Pertinent labs & imaging results that were available during my care of the patient were reviewed by me and considered in my medical decision making (see chart for details).  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  Patient with remote hx of cholecystectomy.   Labs obtained show no significant abnormalities.  Patient without any RLQ pain, no pain over right transplant kidney, doubt appendicitis.  Urinalysis shows no sign UTI. Strep screen negative. Empiric abx ordered given immunocompromised status. Will admit for further care of fever in setting of renal transplant/immunocompromise with unclear source.     Final Clinical Impressions(s) / ED Diagnoses   Final diagnoses:  Personal history of immunosupression therapy  Fever, unspecified fever cause  Right upper quadrant pain    New Prescriptions Current Discharge Medication List       Alvira MondayErin , MD 07/23/16 2107

## 2016-07-23 NOTE — ED Notes (Signed)
Attempted to call report

## 2016-07-23 NOTE — ED Notes (Signed)
Pt c/o itching at head. rednness  Noted at scalp. Vanc stopped. Dr Wyonia HoughGerghe contacted.

## 2016-07-24 DIAGNOSIS — D899 Disorder involving the immune mechanism, unspecified: Secondary | ICD-10-CM

## 2016-07-24 DIAGNOSIS — R1011 Right upper quadrant pain: Principal | ICD-10-CM

## 2016-07-24 DIAGNOSIS — R1013 Epigastric pain: Secondary | ICD-10-CM

## 2016-07-24 DIAGNOSIS — Z940 Kidney transplant status: Secondary | ICD-10-CM

## 2016-07-24 DIAGNOSIS — D849 Immunodeficiency, unspecified: Secondary | ICD-10-CM | POA: Diagnosis present

## 2016-07-24 DIAGNOSIS — R6510 Systemic inflammatory response syndrome (SIRS) of non-infectious origin without acute organ dysfunction: Secondary | ICD-10-CM

## 2016-07-24 LAB — COMPREHENSIVE METABOLIC PANEL
ALBUMIN: 2.7 g/dL — AB (ref 3.5–5.0)
ALT: 17 U/L (ref 14–54)
ANION GAP: 7 (ref 5–15)
AST: 20 U/L (ref 15–41)
Alkaline Phosphatase: 61 U/L (ref 38–126)
BUN: 15 mg/dL (ref 6–20)
CHLORIDE: 108 mmol/L (ref 101–111)
CO2: 22 mmol/L (ref 22–32)
Calcium: 8.3 mg/dL — ABNORMAL LOW (ref 8.9–10.3)
Creatinine, Ser: 1.02 mg/dL — ABNORMAL HIGH (ref 0.44–1.00)
GFR calc non Af Amer: >60 mL/min (ref >60)
GLUCOSE: 107 mg/dL — AB (ref 65–99)
Potassium: 4.1 mmol/L (ref 3.5–5.1)
SODIUM: 137 mmol/L (ref 135–145)
Total Bilirubin: 0.6 mg/dL (ref 0.3–1.2)
Total Protein: 6 g/dL — ABNORMAL LOW (ref 6.5–8.1)

## 2016-07-24 LAB — CBC
HEMATOCRIT: 33.6 % — AB (ref 36.0–46.0)
HEMOGLOBIN: 10.6 g/dL — AB (ref 12.0–15.0)
MCH: 25.9 pg — AB (ref 26.0–34.0)
MCHC: 31.5 g/dL (ref 30.0–36.0)
MCV: 82 fL (ref 78.0–100.0)
Platelets: 214 10*3/uL (ref 150–400)
RBC: 4.1 MIL/uL (ref 3.87–5.11)
RDW: 14.2 % (ref 11.5–15.5)
WBC: 7.1 10*3/uL (ref 4.0–10.5)

## 2016-07-24 LAB — URINE CULTURE: Culture: NO GROWTH

## 2016-07-24 MED ORDER — CYCLOSPORINE 100 MG/ML PO SOLN
90.0000 mg | Freq: Two times a day (BID) | ORAL | Status: DC
  Administered 2016-07-24: 90 mg via ORAL
  Filled 2016-07-24: qty 0.9

## 2016-07-24 MED ORDER — MYCOPHENOLATE MOFETIL 250 MG PO CAPS
500.0000 mg | ORAL_CAPSULE | Freq: Three times a day (TID) | ORAL | Status: DC
  Administered 2016-07-24: 500 mg via ORAL
  Filled 2016-07-24 (×2): qty 2

## 2016-07-24 MED ORDER — METRONIDAZOLE 500 MG PO TABS: 500.0000 mg | ORAL_TABLET | Freq: Three times a day (TID) | ORAL | 0 refills | Status: DC

## 2016-07-24 MED ORDER — CIPROFLOXACIN HCL 500 MG PO TABS: 500.0000 mg | ORAL_TABLET | Freq: Two times a day (BID) | ORAL | 0 refills | Status: DC

## 2016-07-24 NOTE — Discharge Instructions (Signed)
Dolor abdominal en adultos (Abdominal Pain, Adult) El dolor puede tener muchas causas. Normalmente la causa del dolor abdominal no es una enfermedad y Scientist, clinical (histocompatibility and immunogenetics)mejorar sin TEFL teachertratamiento. Frecuentemente puede controlarse y tratarse en casa. Su mdico le Medical sales representativerealizar un examen fsico y posiblemente solicite anlisis de sangre y radiografas para ayudar a Chief Strategy Officerdeterminar la gravedad de su dolor. Sin embargo, en IAC/InterActiveCorpmuchos casos, debe transcurrir ms tiempo antes de que se pueda Clinical research associateencontrar una causa evidente del dolor. Antes de llegar a ese punto, es posible que su mdico no sepa si necesita ms pruebas o un tratamiento ms profundo. INSTRUCCIONES PARA EL CUIDADO EN EL HOGAR  Est atento al dolor para ver si hay cambios. Las siguientes indicaciones ayudarn a Architectural technologistaliviar cualquier molestia que pueda sentir:  [place] solo medicamentos de venta libre o recetados, segn las indicaciones del mdico.  No tome laxantes a menos que se lo haya indicado su mdico.  Pruebe con Neomia Dearuna dieta lquida absoluta (caldo, t o agua) segn se lo indique su mdico. Introduzca gradualmente una dieta normal, segn su tolerancia. SOLICITE ATENCIN MDICA SI:  Tiene dolor abdominal sin explicacin.  Tiene dolor abdominal relacionado con nuseas o diarrea.  Tiene dolor cuando orina o defeca.  Experimenta dolor abdominal que lo despierta de noche.  Tiene dolor abdominal que empeora o mejora cuando come alimentos.  Tiene dolor abdominal que empeora cuando come alimentos grasosos.  Tiene fiebre. SOLICITE ATENCIN MDICA DE INMEDIATO SI:   El dolor no desaparece en un plazo mximo de 2horas.  No deja de (vomitar).  El Engineer, miningdolor se siente solo en partes del abdomen, como el lado derecho o la parte inferior izquierda del abdomen.  Evaca materia fecal sanguinolenta o negra, de aspecto alquitranado. ASEGRESE DE QUE:  Comprende estas instrucciones.  Controlar su afeccin.  Recibir ayuda de inmediato si no mejora o si empeora.   Esta  informacin no tiene Theme park managercomo fin reemplazar el consejo del mdico. Asegrese de hacerle al mdico cualquier pregunta que tenga.   Document Released: 11/20/2005 Document Revised: 12/11/2014 Elsevier Interactive Patient Education 2016 [employer]   ThomasFiebre en los adultos (Fever, Adult) La fiebre es el aumento de la Arts development officertemperatura corporal. A menudo se la define como una temperatura de 100F (38C) o ms. Los episodios de fiebre leve o moderada que duran poco tiempo no tienen efectos a Air cabin crewlargo plazo y, en general, tampoco requieren TEFL teachertratamiento. Los episodios de fiebre moderada o alta pueden resultar molestos. A veces, tambin pueden ser un signo de una enfermedad grave. La sudoracin que se puede presentar cuando los episodios de fiebre son reiterados o duran algn tiempo tambin pueden llevar a que falte lquido en el organismo (deshidratacin). Puede usar un termmetro para tomarse la temperatura y saber si tiene fiebre. La medicin de la temperatura puede cambiar en funcin de los siguientes factores:  La edad.  La hora del da.  El lugar donde se coloca el termmetro:  La boca (oral).  El recto (rectal).  El odo (timpnico).  Debajo del brazo Administrator, Civil Service(axilar).  La frente (temporal). CUIDADOS EN EL HOGAR Est atento a cualquier cambio en los sntomas. Tome estas medidas para controlar la afeccin:  Tome los medicamentos de venta libre y los recetados solamente como se lo haya indicado el mdico. Siga cuidadosamente las indicaciones en cuanto a la dosis.  Si le recetaron un antibitico, tmelo como se lo haya indicado el mdico. No deje de tomar los antibiticos aunque comience a Actorsentirse mejor.  Descanse todo lo que sea necesario.  Beba suficiente lquido  para mantener el pis (orina) claro o de color amarillo plido.  Higiencese con Delma Freezeuna esponja o dese un bao con agua a temperatura ambiente, si es necesario. Esto ayuda a Teaching laboratory technicianbajar la temperatura corporal. No use agua helada.  No se tape con  muchas mantas ni use ropa abrigada. SOLICITE AYUDA SI:  Vomita.  No puede comer o tomar lquido sin vomitar.  La materia fecal es lquida (diarrea).  Siente dolor al ConocoPhillipsorinar.  Los sntomas no mejoran con Scientist, research (medical)el tratamiento.  Aparecen nuevos sntomas.  Se siente muy dbil. SOLICITE AYUDA DE INMEDIATO SI:  Siente falta de aire o dificultad para respirar.  Est mareado o se desvanece (se desmaya).  Se siente confundido.  Tiene signos de falta de lquido en el organismo, por ejemplo:  Sequedad en la boca.  Menor cantidad Koreade orina.  Palidez.  Tiene dolor muy intenso de vientre (abdomen).  No deja de vomitar o las heces son lquidas.  Le aparece una erupcin cutnea.  Los sntomas empeoran repentinamente.   Esta informacin no tiene Theme park managercomo fin reemplazar el consejo del mdico. Asegrese de hacerle al mdico cualquier pregunta que tenga.   Document Released: 08/14/2012 Document Revised: 08/11/2015 Elsevier Interactive Patient Education [employer]2016 Elsevier Inc.

## 2016-07-24 NOTE — Progress Notes (Signed)
Pt discharged from unit via wheelchair to [facility]vt auto home accompanied with family. All discharge instructions reviewed with pt and familty. Phy called in rx for pt. All personal belongings with pt. Pt demonstrates no distress or c/o at time of discharge

## 2016-07-24 NOTE — Discharge Summary (Signed)
Physician Discharge Summary  Sarah Dyer ZOX:[id number] DOB: [birth year] DOA: 07/23/2016  PCP: Trevor IhaETERDING,JAMES L, MD  Admit date: 07/23/2016 Discharge date: 07/24/2016  Admitted From: Home Discharge disposition: Home   Recommendations for Outpatient Follow-Up:   1. F/U with Dr. Detterding for recurrent fever.Please follow-up final blood culture and throat culture results.   Discharge Diagnosis:   Principal Problem:    SIRS (systemic inflammatory response syndrome) (HCC) Active Problems:    Abdominal pain    History of kidney transplant    Epigastric pain    Immunosuppression (HCC)   Discharge Condition: Improved.  Diet recommendation: Regular.   History of Present Illness:   Sarah Dyer is an 40 y.o. female with medical history significant of end-stage renal disease status post [place], followed by Dr. Darrick Pennaeterding as an outpatient, on chronic immune suppression, Who was admitted 07/23/16 with chief complaint of fever, chills, and abdominal pain in the right upper quadrant/epigastric area radiating into her back. In the emergency room, patient was found to be febrile to 101.3, mildly tachycardic to the mid 90s, but had no elevation of white count, lactic acid was normal. She had mild elevation of her creatinine of 1.2. Urinalysis was negative. She was started on antibiotics with vancomycin and Zosyn, cultures were obtained.    Hospital Course by Problem:   Principal Problem:   SIRS (systemic inflammatory response syndrome) (HCC) and abdominal pain in a patient with a history of kidney transplant on chronic immunosuppressive therapy Unclear etiology. Chest x-ray without signs of infection. Urinalysis unremarkable. Abdominal ultrasound showed atrophic native kidneys but no evidence of hepatobiliary pathology or cause of her right upper quadrant pain. Lipase not elevated. Fever curve down on empiric broad-spectrum antibiotics  with vancomycin/Zosyn.We'll discharge home on 6 days of therapy with Cipro/Flagyl. Patient's symptoms had resolved with no recurrent fever and she was nontoxic-appearing at discharge.  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 Prednisone dose increased to address presumed adrenal insufficiency given chronic steroid use. Discussed the case with Dr. Briant CedarMattingly who recommended resuming CellCept and cyclosporine.  Medical Consultants:    None.   Discharge Exam:   Vitals:   07/24/16 0604 07/24/16 1407  BP: (!) 107/57 (!) 102/47  Pulse: (!) 50 (!) 59  Resp:    Temp: 98.4 F (36.9 C) 98.1 F (36.7 C)   Vitals:   07/23/16 1251 07/23/16 2322 07/24/16 0604 07/24/16 1407  BP: (!) 105/53 (!) 102/52 (!) 107/57 (!) 102/47  Pulse: 86 (!) 52 (!) 50 (!) 59  Resp:      Temp: 100 F (37.8 C) 97.7 F (36.5 C) 98.4 F (36.9 C) 98.1 F (36.7 C)  TempSrc: Oral Oral Oral Oral  SpO2: 98%  99% 99%  Weight:      Height:        General exam: Appears calm and comfortable.  Respiratory system: Clear to auscultation. Respiratory effort normal. Cardiovascular system: S1 & S2 heard, RRR. No JVD,  rubs, gallops or clicks. No murmurs. Gastrointestinal system: Abdomen is nondistended, soft and nontender. No organomegaly or masses felt. Normal bowel sounds heard. Central nervous system: Alert and oriented. No focal neurological deficits. Extremities: No clubbing,  or cyanosis. No edema. Skin: No rashes, lesions or ulcers. Psychiatry: Judgement and insight appear normal. Mood & affect appropriate.    The results of significant diagnostics from this hospitalization (including imaging,  microbiology, ancillary and laboratory) are listed below for reference.     Procedures and Diagnostic Studies:   Dg Chest 2 View  Result Date: 07/23/2016 CLINICAL DATA:  Substernal and  right lower chest pain. EXAM: CHEST  2 VIEW COMPARISON:  07/30/2014 FINDINGS: The heart size and mediastinal contours are within normal limits. There is no evidence of pulmonary edema, consolidation, pneumothorax, nodule or pleural fluid. The visualized skeletal structures are unremarkable. IMPRESSION: No active cardiopulmonary disease. Electronically Signed   By: Irish LackGlenn  Yamagata M.D.   On: 07/23/2016 08:19   Abd 1 View (kub)  Result Date: 07/23/2016 CLINICAL DATA:  Right lower quadrant pain EXAM: ABDOMEN - 1 VIEW COMPARISON:  July 31, 2013 FINDINGS: There is fairly diffuse stool throughout the colon. There is no bowel dilatation or air-fluid level to suggest obstruction. No free air. There are surgical clips in the right upper quadrant region. IMPRESSION: No bowel obstruction or free air evident. Fairly diffuse stool throughout colon. Electronically Signed   By: Bretta BangWilliam  Woodruff III M.D.   On: 07/23/2016 11:00   [place] Abdomen Complete  Result Date: 07/23/2016 CLINICAL DATA:  Right upper quadrant pains yesterday morning. Previous cholecystectomy. Previous kidney transplant 2009 for chronic renal disease. EXAM: ABDOMEN ULTRASOUND COMPLETE COMPARISON:  CT 07/24/2013. FINDINGS: Gallbladder: Surgically absent. Common bile duct: Diameter: 5 mm, normal following cholecystectomy. Liver: No focal lesion identified. Within normal limits in parenchymal echogenicity. IVC: No abnormality visualized. Pancreas: Visualized portion unremarkable. Spleen: Size and appearance within normal limits. Right Kidney: Length: The native kidney is markedly atrophic. Left Kidney: Length: Native kidney is markedly atrophic. The transplant kidney was not examined. Abdominal aorta: No aneurysm visualized. Other findings: No ascites IMPRESSION: Atrophic native kidneys.  The transplant kidney was not studied. No evidence of hepatobiliary pathology. No cause of right upper quadrant pain identified. Electronically Signed   By: Paulina FusiMark  Shogry  M.D.   On: 07/23/2016 12:47     Labs:   Basic Metabolic Panel:  Recent Labs Lab 07/23/16 0815 07/24/16 0539  NA 133* 137  K 3.7 4.1  CL 102 108  CO2 23 22  GLUCOSE 86 107*  BUN 17 15  CREATININE 1.26* 1.02*  CALCIUM 8.6* 8.3*   GFR Estimated Creatinine Clearance: 61.2 mL/min (by C-G formula based on SCr of 1.02 mg/dL). Liver Function Tests:  Recent Labs Lab 07/23/16 0815 07/24/16 0539  AST 25 20  ALT 18 17  ALKPHOS 79 61  BILITOT 0.8 0.6  PROT 6.6 6.0*  ALBUMIN 3.4* 2.7*    Recent Labs Lab 07/23/16 0815  LIPASE 26   CBC:  Recent Labs Lab 07/23/16 0815 07/24/16 0539  WBC 8.1 7.1  NEUTROABS 6.3  --   HGB 12.3 10.6*  HCT 38.3 33.6*  MCV 81.7 82.0  PLT 236 214   Microbiology Recent Results (from the past 240 hour(s))  Urine culture     Status: None   Collection Time: 07/23/16  7:35 AM  Result Value Ref Range Status   Specimen Description URINE, CLEAN CATCH  Final   Special Requests NONE  Final   Culture NO GROWTH  Final   Report Status 07/24/2016 FINAL  Final  Blood Culture (routine x 2)     Status: None (Preliminary result)   Collection Time: 07/23/16  8:20 AM  Result Value Ref Range Status   Specimen Description BLOOD RIGHT ANTECUBITAL  Final   Special Requests BOTTLES DRAWN AEROBIC AND ANAEROBIC  5CC  Final   Culture NO GROWTH 1 DAY  Final   Report Status PENDING  Incomplete  Blood Culture (routine x 2)     Status: None (Preliminary result)   Collection Time: 07/23/16  8:30 AM  Result Value Ref Range Status   Specimen Description BLOOD RIGHT WRIST  Final   Special Requests BOTTLES DRAWN AEROBIC AND ANAEROBIC  5CC  Final   Culture NO GROWTH 1 DAY  Final   Report Status PENDING  Incomplete  Rapid strep screen     Status: None   Collection Time: 07/23/16 11:00 AM  Result Value Ref Range Status   Streptococcus, Group A Screen (Direct) NEGATIVE NEGATIVE Final    Comment: (NOTE) A Rapid Antigen test may result negative if the antigen level  in the sample is below the detection level of this test. The FDA has not cleared this test as a stand-alone test therefore the rapid antigen negative result has reflexed to a Group A Strep culture.   Culture, group A strep     Status: None (Preliminary result)   Collection Time: 07/23/16 11:00 AM  Result Value Ref Range Status   Specimen Description THROAT  Final   Special Requests NONE Reflexed from [id number]  Final   Culture CULTURE REINCUBATED FOR BETTER GROWTH  Final   Report Status PENDING  Incomplete     Discharge Instructions:   Discharge Instructions    Call MD for:  persistant nausea and vomiting    Complete by:  As directed   Call MD for:  severe uncontrolled pain    Complete by:  As directed   Call MD for:  temperature >100.4    Complete by:  As directed   Diet general    Complete by:  As directed   Increase activity slowly    Complete by:  As directed       Medication List    TAKE these medications   acetaminophen 500 MG tablet Commonly known as:  TYLENOL Take 500 mg by mouth every 6 (six) hours as needed for pain.   ciprofloxacin 500 MG tablet Commonly known as:  CIPRO Take 1 tablet (500 mg total) by mouth 2 (two) times daily.   cycloSPORINE 100 MG/ML solution Commonly known as:  SANDIMMUNE Take 90 mg by mouth 2 (two) times daily.   metroNIDAZOLE 500 MG tablet Commonly known as:  FLAGYL Take 1 tablet (500 mg total) by mouth 3 (three) times daily.   mycophenolate 500 MG tablet Commonly known as:  CELLCEPT Take 500 mg by mouth 3 (three) times daily.   omeprazole 20 MG capsule Commonly known as:  PRILOSEC Take 2 capsules (40 mg total) by mouth 2 (two) times daily.   predniSONE 5 MG tablet Commonly known as:  DELTASONE Take 5 mg by mouth daily.      Follow-up Information    DETERDING,JAMES L, MD. Schedule an appointment as soon as possible for a visit in 1 week(s).   Specialty:  Nephrology Why:  if symptoms do not resolve. Contact information: [address] [place] [address] [phone]            Time coordinating discharge: 35 minutes.  Signed:  ,  Pager [phone] [facility] 07/24/2016, 4:22 PM

## 2016-07-25 LAB — CULTURE, GROUP A STREP (THRC)

## 2016-07-28 LAB — CULTURE, BLOOD (ROUTINE X 2)
Culture: NO GROWTH
Culture: NO GROWTH

## 2017-08-02 IMAGING — DX DG ABDOMEN 1V
1 series · 1 of 1 positions shown · non-contrast
Comparison: July 31, 2013

CLINICAL DATA: Right lower quadrant pain

EXAM:
ABDOMEN - 1 VIEW

[abdomen kub]
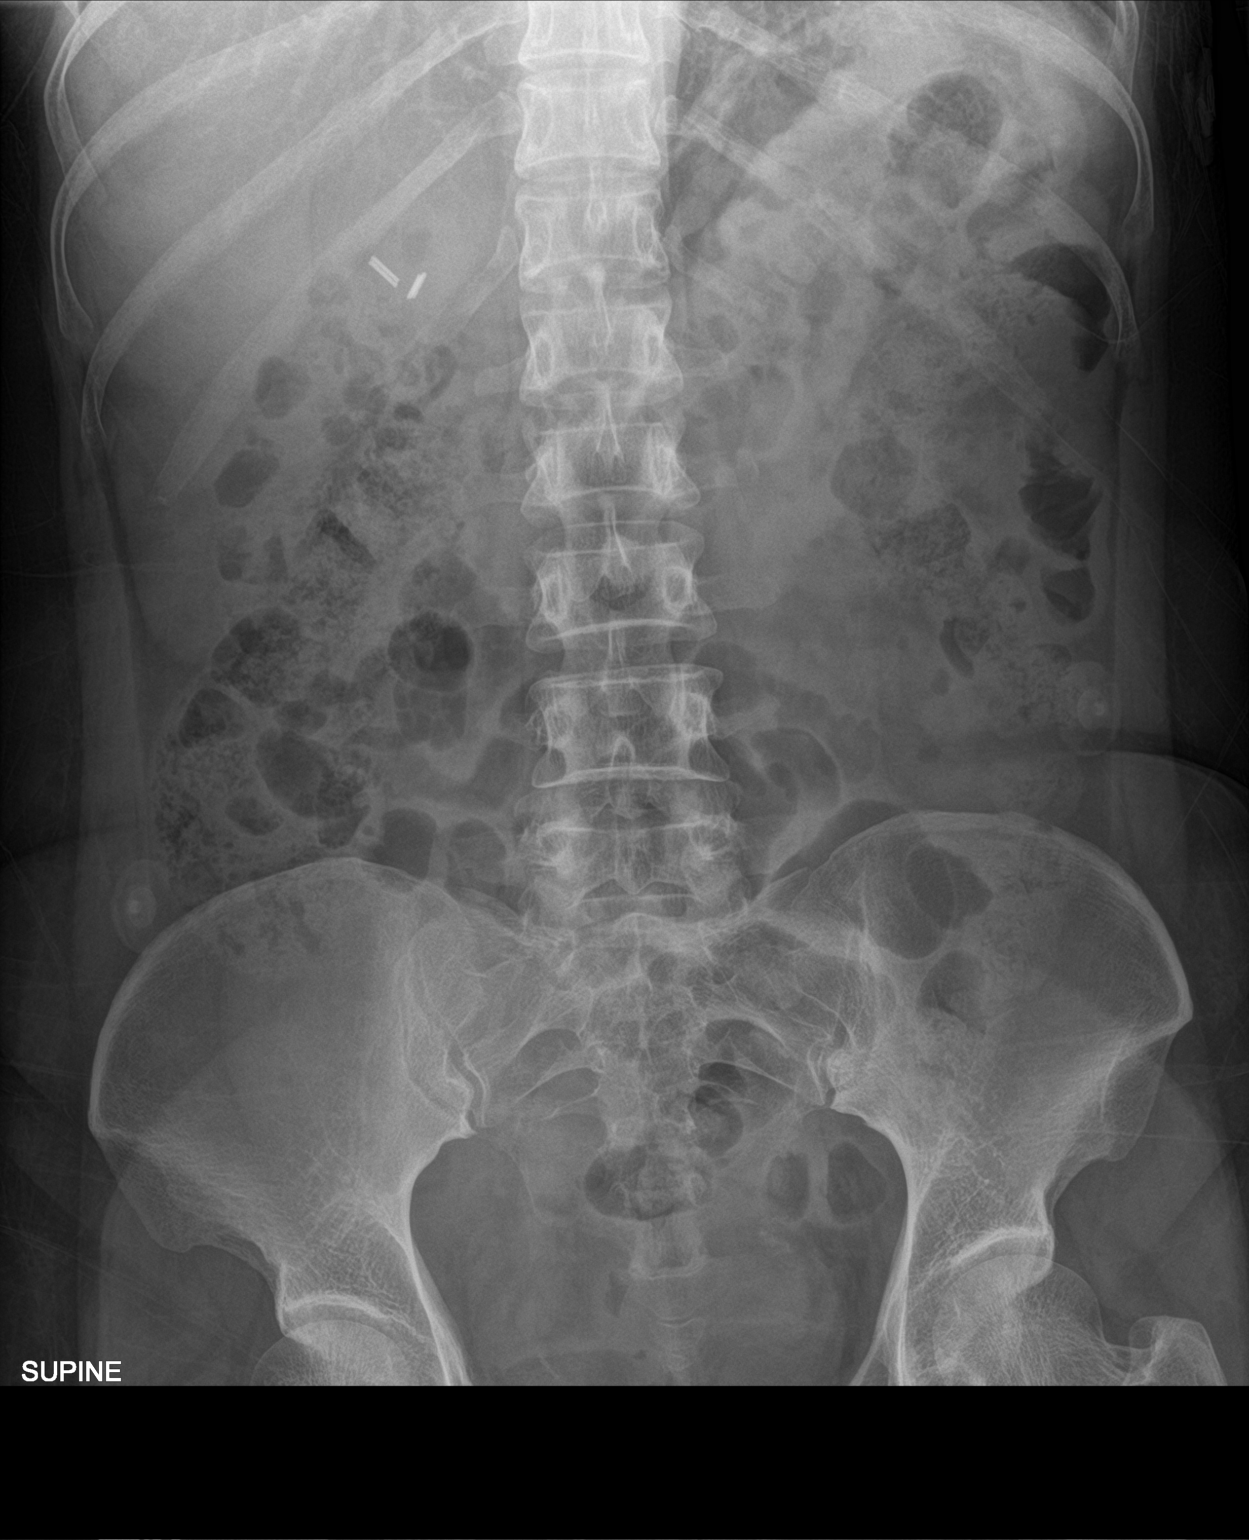

[1 of 1 positions shown; findings below may reference images not displayed]

FINDINGS: There is fairly diffuse stool throughout the colon. There is no
bowel dilatation or air-fluid level to suggest obstruction. No free
air. There are surgical clips in the right upper quadrant region.
IMPRESSION: No bowel obstruction or free air evident. Fairly diffuse stool
throughout colon.

## 2017-08-02 IMAGING — DX DG CHEST 2V
2 series · 2 of 2 positions shown · non-contrast
Comparison: 07/30/2014

CLINICAL DATA: Substernal and right lower chest pain.

EXAM:
CHEST  2 VIEW

[w chest pa]
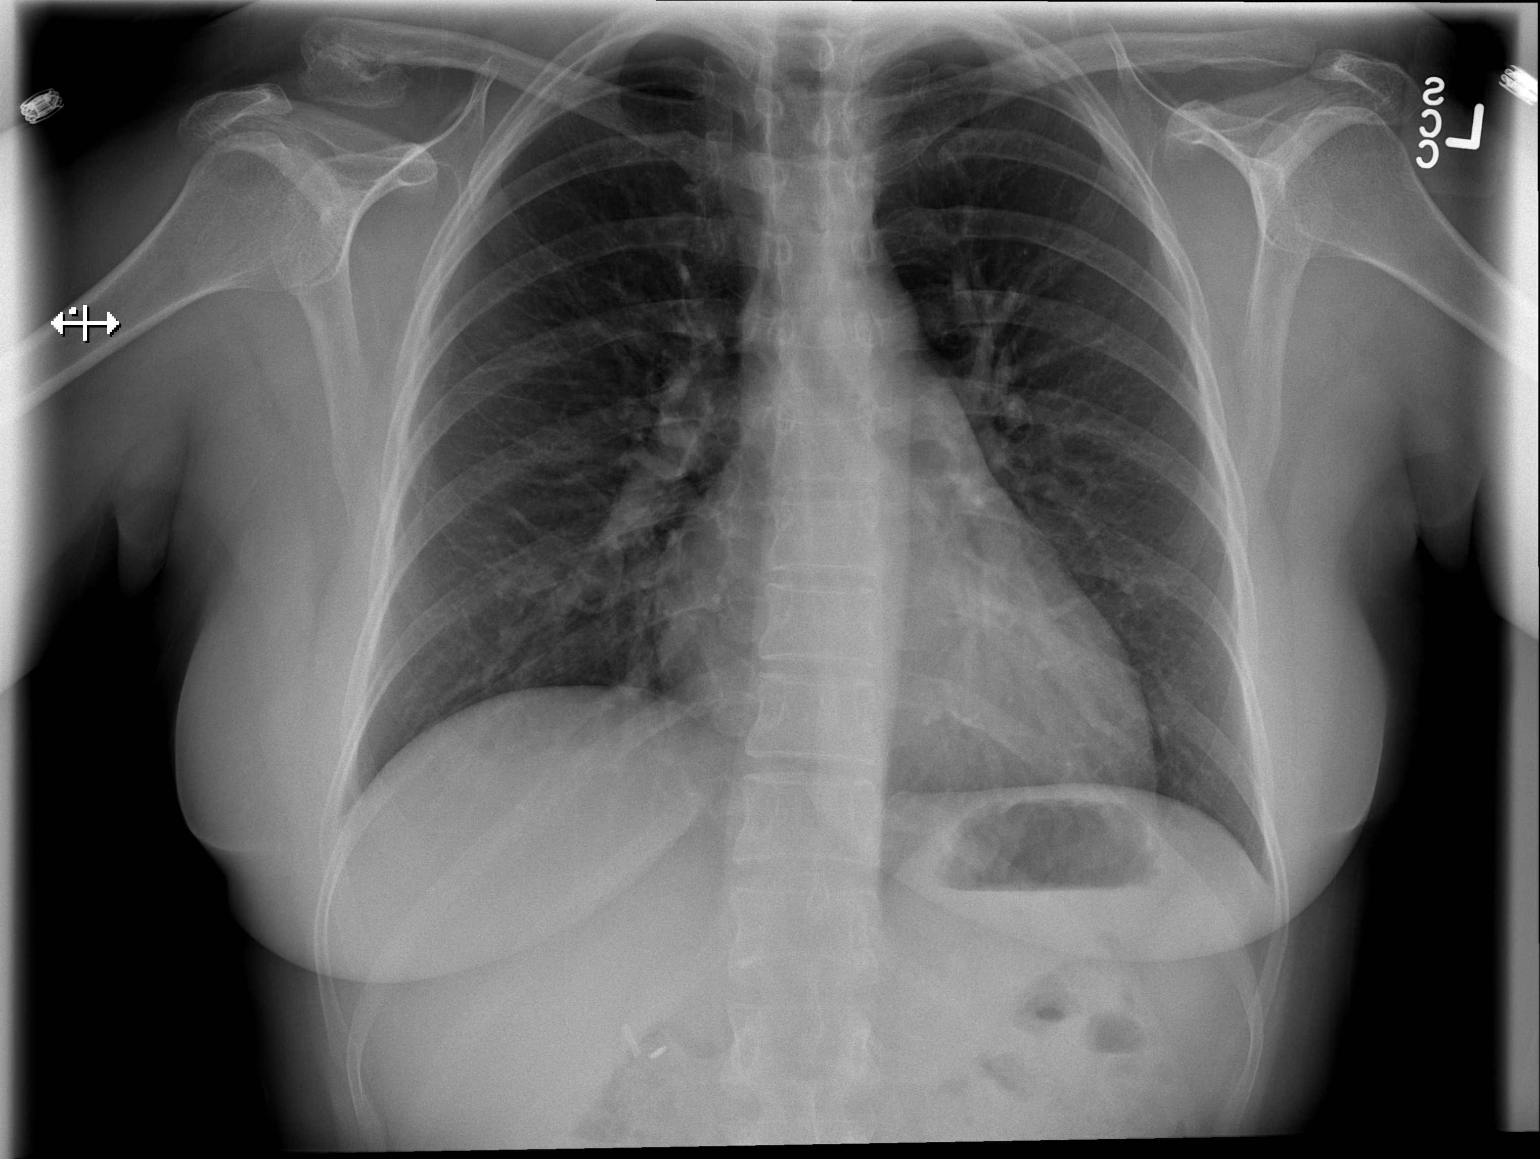

[w chest lat]
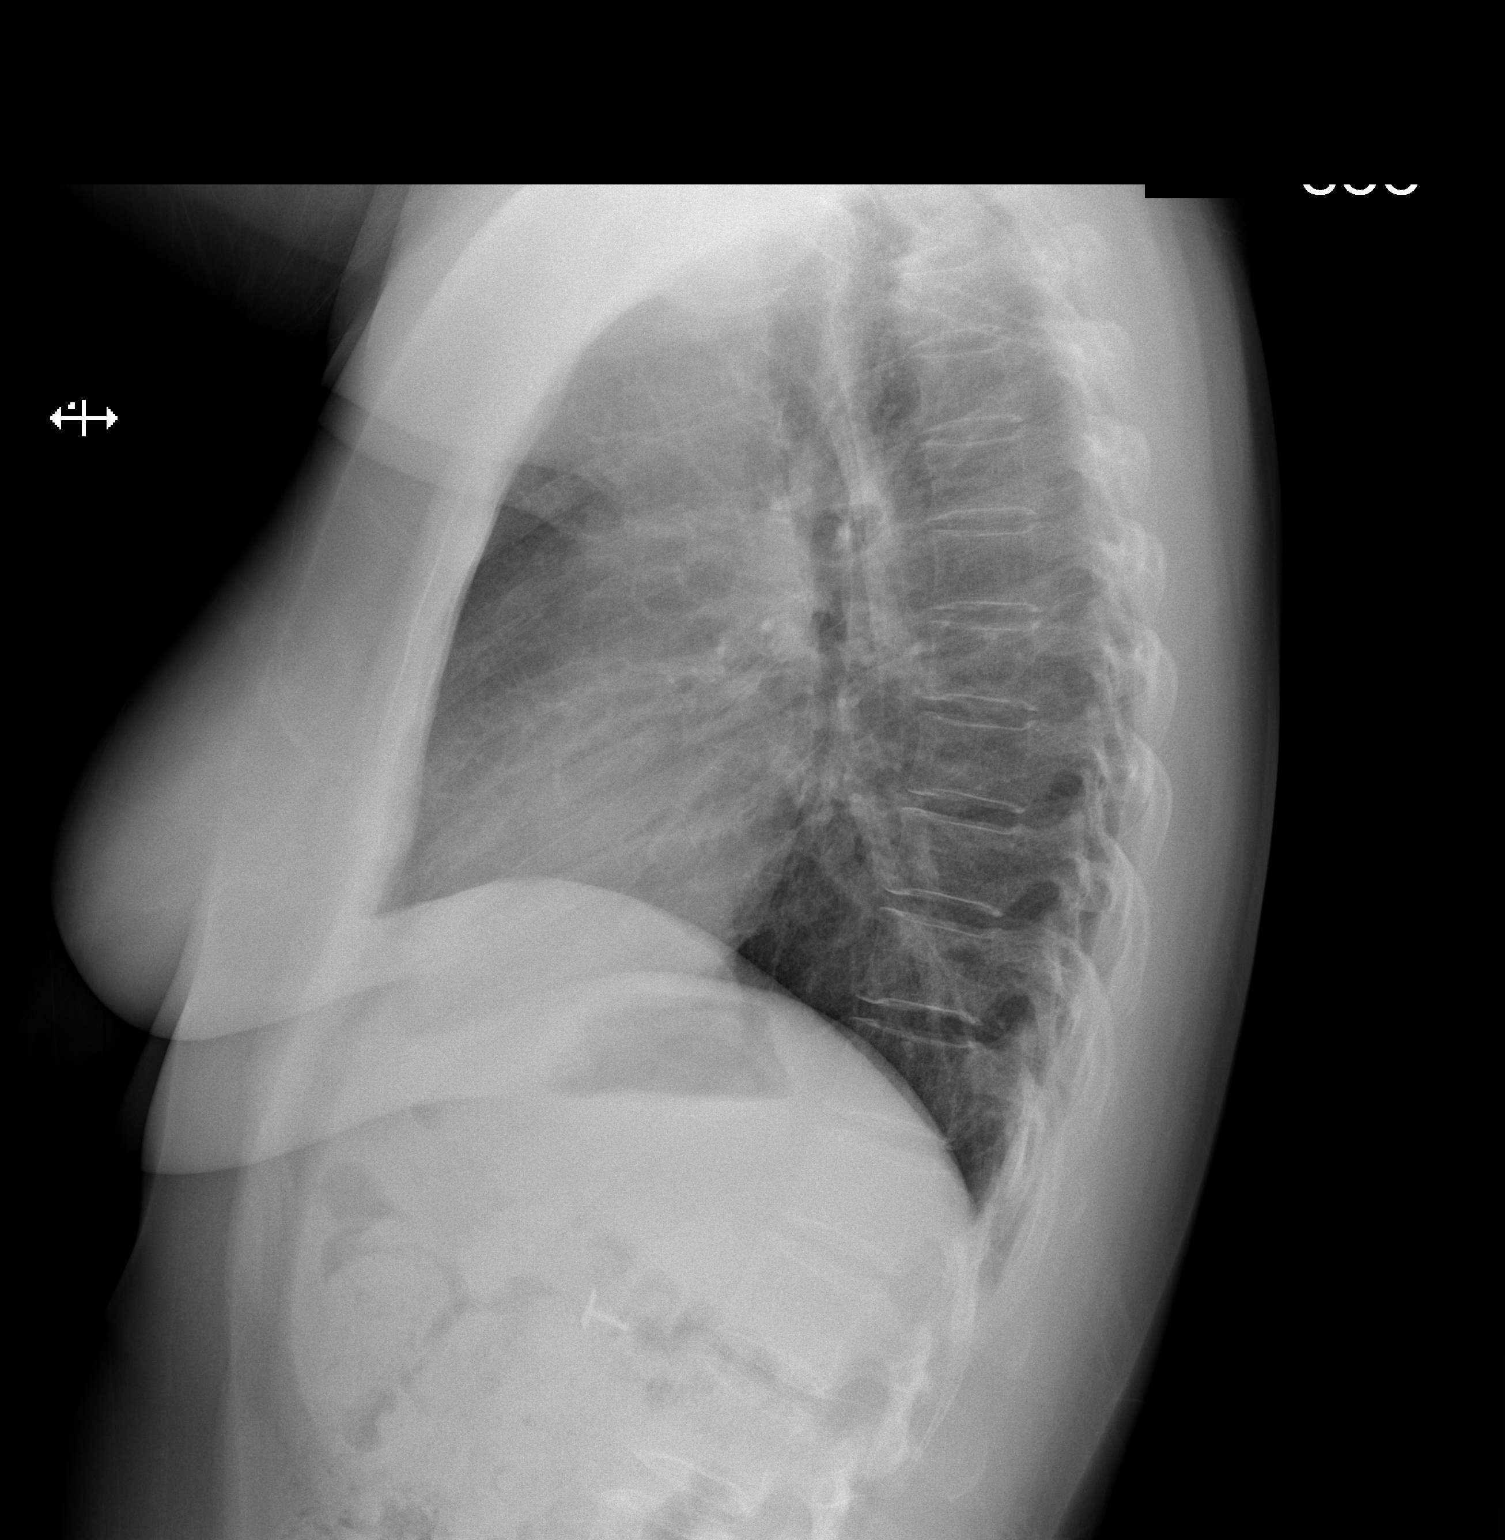

[2 of 2 positions shown; findings below may reference images not displayed]

FINDINGS: The heart size and mediastinal contours are within normal limits.
There is no evidence of pulmonary edema, consolidation,
pneumothorax, nodule or pleural fluid. The visualized skeletal
structures are unremarkable.
IMPRESSION: No active cardiopulmonary disease.

## 2017-12-19 ENCOUNTER — Other Ambulatory Visit: Payer: Self-pay | Admitting: Family Medicine

## 2017-12-19 ENCOUNTER — Other Ambulatory Visit (HOSPITAL_COMMUNITY)
Admission: RE | Admit: 2017-12-19 | Discharge: 2017-12-19 | Disposition: A | Discharge status: Home / Self Care | Payer: BLUE CROSS/BLUE SHIELD | Source: Ambulatory Visit | Attending: Family Medicine | Admitting: Family Medicine

## 2017-12-19 DIAGNOSIS — Z124 Encounter for screening for malignant neoplasm of cervix: Secondary | ICD-10-CM | POA: Insufficient documentation

## 2017-12-20 LAB — CYTOLOGY - PAP
DIAGNOSIS: NEGATIVE
HPV (WINDOPATH): NOT DETECTED

## 2017-12-25 ENCOUNTER — Other Ambulatory Visit: Payer: Self-pay | Admitting: Family Medicine

## 2017-12-25 DIAGNOSIS — Z1231 Encounter for screening mammogram for malignant neoplasm of breast: Secondary | ICD-10-CM

## 2018-01-15 ENCOUNTER — Ambulatory Visit
Admission: RE | Admit: 2018-01-15 | Discharge: 2018-01-15 | Disposition: A | Discharge status: Home / Self Care | Payer: BLUE CROSS/BLUE SHIELD | Source: Ambulatory Visit | Attending: Family Medicine | Admitting: Family Medicine

## 2018-01-15 DIAGNOSIS — Z1231 Encounter for screening mammogram for malignant neoplasm of breast: Secondary | ICD-10-CM

## 2018-04-23 ENCOUNTER — Encounter (HOSPITAL_COMMUNITY): Payer: Self-pay | Admitting: *Deleted

## 2018-04-23 ENCOUNTER — Other Ambulatory Visit: Payer: Self-pay

## 2018-04-23 ENCOUNTER — Emergency Department (HOSPITAL_COMMUNITY)
Admission: EM | Admit: 2018-04-23 | Discharge: 2018-04-24 | Disposition: A | Discharge status: Home / Self Care | Payer: BLUE CROSS/BLUE SHIELD | Attending: Emergency Medicine | Admitting: Emergency Medicine

## 2018-04-23 DIAGNOSIS — Z79899 Other long term (current) drug therapy: Secondary | ICD-10-CM | POA: Diagnosis not present

## 2018-04-23 DIAGNOSIS — R110 Nausea: Secondary | ICD-10-CM | POA: Diagnosis not present

## 2018-04-23 DIAGNOSIS — R1013 Epigastric pain: Secondary | ICD-10-CM | POA: Diagnosis present

## 2018-04-23 DIAGNOSIS — Z940 Kidney transplant status: Secondary | ICD-10-CM | POA: Diagnosis not present

## 2018-04-23 DIAGNOSIS — N189 Chronic kidney disease, unspecified: Secondary | ICD-10-CM | POA: Diagnosis not present

## 2018-04-23 DIAGNOSIS — K2900 Acute gastritis without bleeding: Secondary | ICD-10-CM | POA: Diagnosis not present

## 2018-04-23 LAB — CBC
HCT: 36.1 % (ref 36.0–46.0)
Hemoglobin: 12 g/dL (ref 12.0–15.0)
MCH: 28.8 pg (ref 26.0–34.0)
MCHC: 33.2 g/dL (ref 30.0–36.0)
MCV: 86.6 fL (ref 78.0–100.0)
PLATELETS: 245 10*3/uL (ref 150–400)
RBC: 4.17 MIL/uL (ref 3.87–5.11)
RDW: 13.2 % (ref 11.5–15.5)
WBC: 6.8 10*3/uL (ref 4.0–10.5)

## 2018-04-23 LAB — URINALYSIS, ROUTINE W REFLEX MICROSCOPIC
BILIRUBIN URINE: NEGATIVE
GLUCOSE, UA: NEGATIVE mg/dL
Hgb urine dipstick: NEGATIVE
KETONES UR: NEGATIVE mg/dL
Leukocytes, UA: NEGATIVE
Nitrite: NEGATIVE
PROTEIN: NEGATIVE mg/dL
Specific Gravity, Urine: 1.005 (ref 1.005–1.030)
pH: 5 (ref 5.0–8.0)

## 2018-04-23 LAB — COMPREHENSIVE METABOLIC PANEL
ALT: 15 U/L (ref 14–54)
AST: 23 U/L (ref 15–41)
Albumin: 3.9 g/dL (ref 3.5–5.0)
Alkaline Phosphatase: 71 U/L (ref 38–126)
Anion gap: 9 (ref 5–15)
BUN: 17 mg/dL (ref 6–20)
CHLORIDE: 104 mmol/L (ref 101–111)
CO2: 22 mmol/L (ref 22–32)
CREATININE: 1 mg/dL (ref 0.44–1.00)
Calcium: 9 mg/dL (ref 8.9–10.3)
GFR calc Af Amer: >60 mL/min (ref >60)
Glucose, Bld: 114 mg/dL — ABNORMAL HIGH (ref 65–99)
Potassium: 4.9 mmol/L (ref 3.5–5.1)
SODIUM: 135 mmol/L (ref 135–145)
Total Bilirubin: 1.1 mg/dL (ref 0.3–1.2)
Total Protein: 7.1 g/dL (ref 6.5–8.1)

## 2018-04-23 LAB — I-STAT BETA HCG BLOOD, ED (MC, WL, AP ONLY): I-stat hCG, quantitative: <5 m[IU]/mL (ref <5)

## 2018-04-23 LAB — LIPASE, BLOOD: LIPASE: 32 U/L (ref 11–51)

## 2018-04-23 MED ORDER — PANTOPRAZOLE SODIUM 20 MG PO TBEC
20.0000 mg | DELAYED_RELEASE_TABLET | Freq: Once | ORAL | Status: AC
  Administered 2018-04-24: 20 mg via ORAL
  Filled 2018-04-23: qty 1

## 2018-04-23 MED ORDER — GI COCKTAIL ~~LOC~~
30.0000 mL | Freq: Once | ORAL | Status: AC
  Administered 2018-04-24: 30 mL via ORAL
  Filled 2018-04-23: qty 30

## 2018-04-23 NOTE — ED Notes (Signed)
Bed: WA07 Expected date:  Expected time:  Means of arrival:  Comments: 

## 2018-04-23 NOTE — ED Provider Notes (Signed)
[facility]-EMERGENCY DEPT Provider Note   CSN: [id number] Arrival date & time: 04/23/18  1738     History   Chief Complaint Chief Complaint  Patient presents with  . Abdominal Pain  . Back Pain    HPI Sarah Dyer is a 42 y.o. female.  HPI  This is a 42 year old female with a history of chronic kidney disease status post transplant, gastritis who presents with upper abdominal pain.  Patient reports 1 day history of sharp nonradiating epigastric pain.  It is worse after eating.  She has had similar pain in the past and thinks "I had an infection."  Currently she rates her pain at 10 out of 10.  She has not taken anything for pain today.  She reports nausea without vomiting.  No change in bowel movements.  She denies fevers, vomiting, diarrhea, obtained chest pain, shortness of breath.  Past Medical History:  Diagnosis Date  . Chronic kidney disease   . Gastritis     Patient Active Problem List   Diagnosis Date Noted  . Immunosuppression (HCC) 07/24/2016  . SIRS (systemic inflammatory response syndrome) (HCC) 07/23/2016  . Epigastric pain 07/31/2013  . Abnormal ECG 07/31/2013  . Abdominal pain 07/24/2013  . Nausea & vomiting 07/24/2013  . Gastroenteritis 07/24/2013  . History of kidney transplant 07/24/2013  . RENAL INSUFFICIENCY, CHRONIC 01/31/2007    Past Surgical History:  Procedure Laterality Date  . CHOLECYSTECTOMY    . KIDNEY TRANSPLANT  January2009  . TUBAL LIGATION       OB History   None      Home Medications    Prior to Admission medications   Medication Sig Start Date End Date Taking? Authorizing Provider  acetaminophen (TYLENOL) 500 MG tablet Take 500 mg by mouth every 6 (six) hours as needed for pain.   Yes [provider]  cycloSPORINE (SANDIMMUNE) 100 MG/ML solution Take 90 mg by mouth 2 (two) times daily.   Yes [provider]  mycophenolate (CELLCEPT) 500 MG tablet Take 500 mg by mouth 3  (three) times daily.   Yes [provider]  predniSONE (DELTASONE) 5 MG tablet Take 5 mg by mouth daily.   Yes [provider]  ciprofloxacin (CIPRO) 500 MG tablet Take 1 tablet (500 mg total) by mouth 2 (two) times daily. Patient not taking: Reported on 04/23/2018 07/24/16   Rama, Maryruth Bun, MD  metroNIDAZOLE (FLAGYL) 500 MG tablet Take 1 tablet (500 mg total) by mouth 3 (three) times daily. Patient not taking: Reported on 04/23/2018 07/24/16   Rama, Maryruth Bun, MD  omeprazole (PRILOSEC) 20 MG capsule Take 2 capsules (40 mg total) by mouth 2 (two) times daily. 04/24/18   Horton, Mayer Masker, MD    Family History No family history on file.  Social History Social History   Tobacco Use  . Smoking status: Never Smoker  . Smokeless tobacco: Never Used  Substance Use Topics  . Alcohol use: No  . Drug use: Not on file     Allergies   Patient has no known allergies.   Review of Systems Review of Systems  Constitutional: Negative for fever.  Respiratory: Negative for shortness of breath.   Cardiovascular: Negative for chest pain.  Gastrointestinal: Positive for abdominal pain and nausea. Negative for blood in stool, constipation and vomiting.  Genitourinary: Negative for dysuria.  All other systems reviewed and are negative.    Physical Exam Updated Vital Signs BP 137/74 (BP Location: Right Arm)   Pulse  71   Temp 98.3 F (36.8 C) (Oral)   Resp 14   Ht 5' (1.524 m)   Wt 59.4 kg (131 lb)   SpO2 100%   BMI 25.58 kg/m   Physical Exam  Constitutional: She is oriented to person, place, and time. She appears well-developed and well-nourished.  HENT:  Head: Normocephalic and atraumatic.  Neck: Neck supple.  Cardiovascular: Normal rate, regular rhythm and normal heart sounds.  Pulmonary/Chest: Effort normal. No respiratory distress. She has no wheezes.  Abdominal: Soft. Bowel sounds are normal. There is tenderness in the epigastric area. There is no rebound,  no guarding and negative Murphy's sign.  Neurological: She is alert and oriented to person, place, and time.  Skin: Skin is warm and dry.  Psychiatric: She has a normal mood and affect.  Nursing note and vitals reviewed.    ED Treatments / Results  Labs (all labs ordered are listed, but only abnormal results are displayed) Labs Reviewed  COMPREHENSIVE METABOLIC PANEL - Abnormal; Notable for the following components:      Result Value   Glucose, Bld 114 (*)    All other components within normal limits  URINALYSIS, ROUTINE W REFLEX MICROSCOPIC - Abnormal; Notable for the following components:   Color, Urine STRAW (*)    All other components within normal limits  LIPASE, BLOOD  CBC  I-STAT BETA HCG BLOOD, ED ([facility], [facility], [facility] ONLY)    EKG None  Radiology No results found.  Procedures Procedures (including critical care time)  Medications Ordered in ED Medications  pantoprazole (PROTONIX) EC tablet 20 mg (has no administration in time range)  gi cocktail (Maalox,Lidocaine,Donnatal) (30 mLs Oral Given 04/24/18 0023)     Initial Impression / Assessment and Plan / ED Course  I have reviewed the triage vital signs and the nursing notes.  Pertinent labs & imaging results that were available during my care of the patient were reviewed by me and considered in my medical decision making (see chart for details).     Patient presents with upper abdominal pain.  Worse with eating.  Vital signs are reassuring and she is overall nontoxic-appearing on exam.  She is status post cholecystectomy.  Considerations include pancreatitis, gastritis.  Doubt obstruction, appendicitis.  She does have a history of gastritis in the past.  She takes omeprazole 20 mg once daily.  She has never had an EGD.  Her lab work-up was reviewed and is reassuring including liver function testing and lipase.  She was given a GI cocktail.  On recheck, she states she feels much better.  Will increase omeprazole to twice  daily and have her follow-up with GI.  Do not feel she needs imaging at this time.  After history, exam, and medical workup I feel the patient has been appropriately medically screened and is safe for discharge home. Pertinent diagnoses were discussed with the patient. Patient was given return precautions.   Final Clinical Impressions(s) / ED Diagnoses   Final diagnoses:  Epigastric pain  Acute gastritis without hemorrhage, unspecified gastritis type    ED Discharge Orders        Ordered    omeprazole (PRILOSEC) 20 MG capsule  2 times daily     04/24/18 0101       Horton, Mayer Masker, MD 04/24/18 0105

## 2018-04-23 NOTE — ED Notes (Signed)
Patient unable to give urine sample at this time

## 2018-04-23 NOTE — ED Notes (Signed)
Bed: WA02 Expected date:  Expected time:  Means of arrival:  Comments: 

## 2018-04-23 NOTE — ED Triage Notes (Addendum)
2 days of epigastric pain going into back, history of multiple problems. Pt has had some nausea

## 2018-04-24 MED ORDER — OMEPRAZOLE 20 MG PO CPDR: 40.0000 mg | DELAYED_RELEASE_CAPSULE | Freq: Two times a day (BID) | ORAL | 0 refills | Status: DC

## 2018-04-24 NOTE — Discharge Instructions (Addendum)
Follow-up with gastroenterology if not improving.  Increase omeprazole to 40 mg twice daily.

## 2018-08-07 ENCOUNTER — Ambulatory Visit (HOSPITAL_COMMUNITY)
Admission: EM | Admit: 2018-08-07 | Discharge: 2018-08-07 | Disposition: A | Discharge status: Home / Self Care | Payer: BLUE CROSS/BLUE SHIELD | Attending: Family Medicine | Admitting: Family Medicine

## 2018-08-07 ENCOUNTER — Encounter (HOSPITAL_COMMUNITY): Payer: Self-pay | Admitting: Emergency Medicine

## 2018-08-07 DIAGNOSIS — K2950 Unspecified chronic gastritis without bleeding: Secondary | ICD-10-CM | POA: Diagnosis not present

## 2018-08-07 MED ORDER — GI COCKTAIL ~~LOC~~
ORAL | Status: AC
  Filled 2018-08-07: qty 30

## 2018-08-07 MED ORDER — OMEPRAZOLE 20 MG PO CPDR: 20.0000 mg | DELAYED_RELEASE_CAPSULE | Freq: Two times a day (BID) | ORAL | 0 refills | Status: DC

## 2018-08-07 MED ORDER — GI COCKTAIL ~~LOC~~
30.0000 mL | Freq: Once | ORAL | Status: AC
Start: 2018-08-07 — End: 2018-08-07
  Administered 2018-08-07: 30 mL via ORAL

## 2018-08-07 MED ORDER — RANITIDINE HCL 150 MG PO CAPS: 150.0000 mg | ORAL_CAPSULE | Freq: Every day | ORAL | 0 refills | Status: DC

## 2018-08-07 NOTE — ED Provider Notes (Signed)
[facility]    CSN: [id number] Arrival date & time: 08/07/18  1205     History   Chief Complaint Chief Complaint  Patient presents with  . Abdominal Pain    HPI Sarah Dyer is a 42 y.o. female.   Patient is a 42 year old female that presents with 1 day of epigastric pain, nausea, chills.  She does have a history of gastritis and GERD.  Also has a history of chronic kidney disease and renal transplant.  Her symptoms have remained the same since yesterday.  She takes 20 mg of omeprazole daily for her acid reflux.  This typically helps with her symptoms.  She denies any back pain, fever, vomiting, diarrhea or constipation. She denies any chest pain, SOB.   She does not smoke  ROS per HPI       Past Medical History:  Diagnosis Date  . Chronic kidney disease   . Gastritis     Patient Active Problem List   Diagnosis Date Noted  . Immunosuppression (HCC) 07/24/2016  . SIRS (systemic inflammatory response syndrome) (HCC) 07/23/2016  . Epigastric pain 07/31/2013  . Abnormal ECG 07/31/2013  . Abdominal pain 07/24/2013  . Nausea & vomiting 07/24/2013  . Gastroenteritis 07/24/2013  . History of kidney transplant 07/24/2013  . RENAL INSUFFICIENCY, CHRONIC 01/31/2007    Past Surgical History:  Procedure Laterality Date  . CHOLECYSTECTOMY    . KIDNEY TRANSPLANT  January2009  . TUBAL LIGATION      OB History   None      Home Medications    Prior to Admission medications   Medication Sig Start Date End Date Taking? Authorizing Provider  acetaminophen (TYLENOL) 500 MG tablet Take 500 mg by mouth every 6 (six) hours as needed for pain.    [provider]  ciprofloxacin (CIPRO) 500 MG tablet Take 1 tablet (500 mg total) by mouth 2 (two) times daily. Patient not taking: Reported on 04/23/2018 07/24/16   Rama, Maryruth Bun, MD  cycloSPORINE (SANDIMMUNE) 100 MG/ML solution Take 90 mg by mouth 2 (two) times daily.    [provider]    metroNIDAZOLE (FLAGYL) 500 MG tablet Take 1 tablet (500 mg total) by mouth 3 (three) times daily. Patient not taking: Reported on 04/23/2018 07/24/16   Rama, Maryruth Bun, MD  mycophenolate (CELLCEPT) 500 MG tablet Take 500 mg by mouth 3 (three) times daily.    [provider]  omeprazole (PRILOSEC) 20 MG capsule Take 1 capsule (20 mg total) by mouth 2 (two) times daily. 08/07/18   Dahlia Byes A, NP  predniSONE (DELTASONE) 5 MG tablet Take 5 mg by mouth daily.    [provider]  ranitidine (ZANTAC) 150 MG capsule Take 1 capsule (150 mg total) by mouth daily. 08/07/18   Janace Aris, NP    Family History History reviewed. No pertinent family history.  Social History Social History   Tobacco Use  . Smoking status: Never Smoker  . Smokeless tobacco: Never Used  Substance Use Topics  . Alcohol use: No  . Drug use: Not on file     Allergies   Patient has no known allergies.   Review of Systems Review of Systems   Physical Exam Triage Vital Signs ED Triage Vitals [08/07/18 1307]  Enc Vitals Group     BP 115/72     Pulse Rate 92     Resp 18     Temp 97.8 F (36.6 C)     Temp Source Oral  SpO2 99 %     Weight      Height      Head Circumference      Peak Flow      Pain Score      Pain Loc      Pain Edu?      Excl. in GC?    No data found.  Updated Vital Signs BP 115/72 (BP Location: Right Arm)   Pulse 92   Temp 97.8 F (36.6 C) (Oral)   Resp 18   SpO2 99%   Visual Acuity Right Eye Distance:   Left Eye Distance:   Bilateral Distance:    Right Eye Near:   Left Eye Near:    Bilateral Near:     Physical Exam  Constitutional: She appears well-developed and well-nourished.  Non-toxic appearance. No distress.  Very pleasant.  Patient appears ill and in pain     HENT:  Head: Normocephalic and atraumatic.  Cardiovascular: Normal rate and normal heart sounds.  Pulmonary/Chest: Effort normal.  Abdominal: Soft. Normal appearance and bowel  sounds are normal. She exhibits no shifting dullness, no distension, no pulsatile liver, no fluid wave, no abdominal bruit, no ascites, no pulsatile midline mass and no mass. There is no hepatosplenomegaly, splenomegaly or hepatomegaly. There is tenderness in the right upper quadrant, epigastric area and left upper quadrant. There is no rigidity, no rebound, no guarding, no CVA tenderness, no tenderness at McBurney's point and negative Murphy's sign.  Tenderness to palpation of epigastric area, right and left upper quadrants.  No lower abdominal tenderness, rebound.   Neurological: She is alert.  Skin: Skin is warm and dry.  Psychiatric: She has a normal mood and affect.  Nursing note and vitals reviewed.    UC Treatments / Results  Labs (all labs ordered are listed, but only abnormal results are displayed) Labs Reviewed - No data to display  EKG None  Radiology No results found.  Procedures Procedures (including critical care time)  Medications Ordered in UC Medications  gi cocktail (Maalox,Lidocaine,Donnatal) (30 mLs Oral Given 08/07/18 1340)    Initial Impression / Assessment and Plan / UC Course  I have reviewed the triage vital signs and the nursing notes.  Pertinent labs & imaging results that were available during my care of the patient were reviewed by me and considered in my medical decision making (see chart for details).     We will give GI cocktail in clinic to see if this helps with her symptoms Somewhat improved after GI cocktail Will discharge patient home with instructions to increase her 20 mg of omeprazole to twice a day. She can also take ranitidine at bedtime as needed. If she is not seeing any improvement in symptoms in the next few weeks she will need to follow-up with GI specialist. Patient understanding and agreeable to plan. For any worsening symptoms despite new treatment she will need to go to the ER.  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  I am glad that the GI cocktail gave you some relief I want you to increase your omeprazole to 20 mg twice a day.  Try this for a few weeks.  I am also going to give you a prescription for ranitidine to take as needed or at bedtime.  If you do not see any improvement in symptoms please follow-up with  the GI specialist. Make sure that you take the omeprazole 30- 60 minutes prior to a meal with a glass of water.  Avoid spicy, greasy foods, caffeine, chocolate and milk products.  No eating 2-3 hours before bedtime. Elevate the head of the bed 30 degrees.      ED Prescriptions    Medication Sig Dispense Auth. Provider   ranitidine (ZANTAC) 150 MG capsule Take 1 capsule (150 mg total) by mouth daily. 30 capsule ,  A, NP   omeprazole (PRILOSEC) 20 MG capsule  (Status: Discontinued) Take 1 capsule (20 mg total) by mouth 2 (two) times daily. 60 capsule ,  A, NP   omeprazole (PRILOSEC) 20 MG capsule Take 1 capsule (20 mg total) by mouth 2 (two) times daily. 60 capsule Dahlia Byes A, NP     Controlled Substance Prescriptions [place] Controlled Substance Registry consulted? Not Applicable   Janace Aris, NP 08/07/18 1456

## 2018-08-07 NOTE — Discharge Instructions (Addendum)
It was nice meeting you!!  I am glad that the GI cocktail gave you some relief I want you to increase your omeprazole to 20 mg twice a day.  Try this for a few weeks.  I am also going to give you a prescription for ranitidine to take as needed or at bedtime.  If you do not see any improvement in symptoms please follow-up with the GI specialist. Make sure that you take the omeprazole 30- 60 minutes prior to a meal with a glass of water.  Avoid spicy, greasy foods, caffeine, chocolate and milk products.  No eating 2-3 hours before bedtime. Elevate the head of the bed 30 degrees.

## 2018-08-07 NOTE — ED Triage Notes (Signed)
Pt here for mid abd pain starting last night with nausea; pt sts hx of having gall bladder removed; pt is kidney transplant pt

## 2018-12-11 ENCOUNTER — Other Ambulatory Visit: Payer: Self-pay | Admitting: Nephrology

## 2018-12-11 DIAGNOSIS — Z1231 Encounter for screening mammogram for malignant neoplasm of breast: Secondary | ICD-10-CM

## 2019-01-17 ENCOUNTER — Other Ambulatory Visit: Payer: Self-pay | Admitting: Family Medicine

## 2019-01-17 ENCOUNTER — Ambulatory Visit
Admission: RE | Admit: 2019-01-17 | Discharge: 2019-01-17 | Disposition: A | Discharge status: Home / Self Care | Payer: BLUE CROSS/BLUE SHIELD | Source: Ambulatory Visit | Attending: Nephrology | Admitting: Nephrology

## 2019-01-17 DIAGNOSIS — Z1231 Encounter for screening mammogram for malignant neoplasm of breast: Secondary | ICD-10-CM

## 2019-10-11 ENCOUNTER — Other Ambulatory Visit: Payer: Self-pay

## 2019-10-11 DIAGNOSIS — Z20822 Contact with and (suspected) exposure to covid-19: Secondary | ICD-10-CM

## 2019-10-13 LAB — NOVEL CORONAVIRUS, NAA: SARS-CoV-2, NAA: NOT DETECTED

## 2020-02-25 ENCOUNTER — Other Ambulatory Visit: Payer: Self-pay | Admitting: Family Medicine

## 2020-02-25 DIAGNOSIS — Z1231 Encounter for screening mammogram for malignant neoplasm of breast: Secondary | ICD-10-CM

## 2020-03-22 ENCOUNTER — Other Ambulatory Visit: Payer: Self-pay

## 2020-03-22 ENCOUNTER — Ambulatory Visit
Admission: RE | Admit: 2020-03-22 | Discharge: 2020-03-22 | Disposition: A | Discharge status: Home / Self Care | Payer: 59 | Source: Ambulatory Visit | Attending: Family Medicine | Admitting: Family Medicine

## 2020-03-22 DIAGNOSIS — Z1231 Encounter for screening mammogram for malignant neoplasm of breast: Secondary | ICD-10-CM

## 2020-08-06 ENCOUNTER — Other Ambulatory Visit: Payer: 59

## 2020-08-06 ENCOUNTER — Other Ambulatory Visit: Payer: Self-pay

## 2020-08-06 ENCOUNTER — Other Ambulatory Visit: Payer: Self-pay | Admitting: *Deleted

## 2020-08-06 DIAGNOSIS — Z20822 Contact with and (suspected) exposure to covid-19: Secondary | ICD-10-CM

## 2020-08-07 LAB — NOVEL CORONAVIRUS, NAA: SARS-CoV-2, NAA: NOT DETECTED

## 2021-02-28 ENCOUNTER — Other Ambulatory Visit: Payer: Self-pay | Admitting: Family Medicine

## 2021-02-28 ENCOUNTER — Ambulatory Visit
Admission: RE | Admit: 2021-02-28 | Discharge: 2021-02-28 | Disposition: A | Discharge status: Home / Self Care | Payer: 59 | Source: Ambulatory Visit | Attending: Family Medicine | Admitting: Family Medicine

## 2021-02-28 DIAGNOSIS — Z1231 Encounter for screening mammogram for malignant neoplasm of breast: Secondary | ICD-10-CM

## 2021-02-28 DIAGNOSIS — R52 Pain, unspecified: Secondary | ICD-10-CM

## 2021-04-20 ENCOUNTER — Ambulatory Visit
Admission: RE | Admit: 2021-04-20 | Discharge: 2021-04-20 | Disposition: A | Discharge status: Home / Self Care | Payer: 59 | Source: Ambulatory Visit | Attending: Family Medicine | Admitting: Family Medicine

## 2021-04-20 ENCOUNTER — Other Ambulatory Visit: Payer: Self-pay

## 2021-04-20 DIAGNOSIS — Z1231 Encounter for screening mammogram for malignant neoplasm of breast: Secondary | ICD-10-CM

## 2021-06-28 ENCOUNTER — Other Ambulatory Visit: Payer: Self-pay | Admitting: Internal Medicine

## 2021-06-28 ENCOUNTER — Ambulatory Visit
Admission: RE | Admit: 2021-06-28 | Discharge: 2021-06-28 | Disposition: A | Discharge status: Home / Self Care | Payer: 59 | Source: Ambulatory Visit | Attending: Internal Medicine | Admitting: Internal Medicine

## 2021-06-28 ENCOUNTER — Other Ambulatory Visit: Payer: Self-pay

## 2021-06-28 DIAGNOSIS — T1490XA Injury, unspecified, initial encounter: Secondary | ICD-10-CM

## 2021-07-04 ENCOUNTER — Ambulatory Visit (INDEPENDENT_AMBULATORY_CARE_PROVIDER_SITE_OTHER): Payer: 59 | Admitting: Orthopedic Surgery

## 2021-07-04 ENCOUNTER — Encounter: Payer: Self-pay | Admitting: Orthopedic Surgery

## 2021-07-04 DIAGNOSIS — S93492A Sprain of other ligament of left ankle, initial encounter: Secondary | ICD-10-CM | POA: Diagnosis not present

## 2021-07-04 NOTE — Progress Notes (Signed)
Office Visit Note   Patient: Sarah Dyer           Date of Birth: 1976-07-01           MRN: [id number] Visit Date: 07/04/2021              Requested by: Lewis Moccasin, MD [address] [place],  [place] [address] PCP: Lewis Moccasin, MD  Chief Complaint  Patient presents with   Right Ankle - Pain      HPI: Patient is seen for initial evaluation for right ankle pain she fell about a month ago she complains of pain directly over the syndesmosis.  Patient has had dialysis 12 years ago and is status post renal transplant.  Assessment & Plan: Visit Diagnoses:  1. High ankle sprain of left lower extremity, initial encounter     Plan: Recommend that she continue wearing the ankle brace discussed that this will take several months for the high ankle sprain to resolve.  Recommended that she could continue with Voltaren gel.  Follow-up if she is still symptomatic.  Follow-Up Instructions: Return if symptoms worsen or fail to improve.   Ortho Exam  Patient is alert, oriented, no adenopathy, well-dressed, normal affect, normal respiratory effort. Examination patient is point tender to palpation of the syndesmosis consistent with a high ankle sprain.  Lateral compression of the tibia and fibula reproduces pain.  Anterior drawer is stable the lateral ankle ligaments are minimally tender to palpation the deltoid is nontender to palpation.  Patient also has some pain over the dorsum of the proximal phalanx left thumb the first dorsal extensor compartment is asymptomatic the ulnar collateral ligament is asymptomatic the base of the thumb carpometacarpal joint is asymptomatic patient has active flexion and extension.  Imaging: No results found. No images are attached to the encounter.  Labs: Lab Results  Component Value Date   ESRSEDRATE 10 07/31/2013   REPTSTATUS 07/25/2016 FINAL 07/23/2016   CULT NO GROUP A STREP (S.PYOGENES) ISOLATED 07/23/2016     Lab  Results  Component Value Date   ALBUMIN 3.9 04/23/2018   ALBUMIN 2.7 (L) 07/24/2016   ALBUMIN 3.4 (L) 07/23/2016    No results found for: MG No results found for: VD25OH  No results found for: PREALBUMIN CBC EXTENDED Latest Ref Rng & Units 04/23/2018 07/24/2016 07/23/2016  WBC 4.0 - 10.5 K/uL 6.8 7.1 8.1  RBC 3.87 - 5.11 MIL/uL 4.17 4.10 4.69  HGB 12.0 - 15.0 g/dL 46.5 10.6(L) 12.3  HCT 36.0 - 46.0 % 36.1 33.6(L) 38.3  PLT 150 - 400 K/uL 245 214 236  NEUTROABS 1.7 - 7.7 K/uL - - 6.3  LYMPHSABS 0.7 - 4.0 K/uL - - 0.9     There is no height or weight on file to calculate BMI.  Orders:  No orders of the defined types were placed in this encounter.  No orders of the defined types were placed in this encounter.    Procedures: No procedures performed  Clinical Data: No additional findings.  ROS:  All other systems negative, except as noted in the HPI. Review of Systems  Objective: Vital Signs: There were no vitals taken for this visit.  Specialty Comments:  No specialty comments available.  PMFS History: Patient Active Problem List   Diagnosis Date Noted   Immunosuppression (HCC) 07/24/2016   SIRS (systemic inflammatory response syndrome) (HCC) 07/23/2016   Epigastric pain 07/31/2013   Abnormal ECG 07/31/2013   Abdominal pain 07/24/2013  Nausea & vomiting 07/24/2013   Gastroenteritis 07/24/2013   History of kidney transplant 07/24/2013   RENAL INSUFFICIENCY, CHRONIC 01/31/2007   Past Medical History:  Diagnosis Date   Chronic kidney disease    Gastritis     History reviewed. No pertinent family history.  Past Surgical History:  Procedure Laterality Date   CHOLECYSTECTOMY     KIDNEY TRANSPLANT  January2009   TUBAL LIGATION     Social History   Occupational History   Not on file  Tobacco Use   Smoking status: Never   Smokeless tobacco: Never  Substance and Sexual Activity   Alcohol use: No   Drug use: Not on file   Sexual activity: Not on file

## 2021-07-26 ENCOUNTER — Telehealth: Payer: Self-pay | Admitting: Orthopedic Surgery

## 2021-07-26 ENCOUNTER — Other Ambulatory Visit: Payer: Self-pay

## 2021-07-26 DIAGNOSIS — S93492A Sprain of other ligament of left ankle, initial encounter: Secondary | ICD-10-CM

## 2021-07-26 NOTE — Telephone Encounter (Signed)
Called to advise per Dr. Lajoyce Corners ok for MRI eval syndesmotic injury. Right ankle. Order entered and pts daughter is aware. To call with any other questions.

## 2021-07-26 NOTE — Telephone Encounter (Signed)
Patient's daughter called. Says her ankle is worst. Would like to know if she could get a MRI?

## 2021-07-27 ENCOUNTER — Ambulatory Visit: Payer: 59 | Admitting: Family

## 2022-03-22 ENCOUNTER — Other Ambulatory Visit (HOSPITAL_BASED_OUTPATIENT_CLINIC_OR_DEPARTMENT_OTHER): Payer: Self-pay | Admitting: Family Medicine

## 2022-03-22 ENCOUNTER — Ambulatory Visit (HOSPITAL_BASED_OUTPATIENT_CLINIC_OR_DEPARTMENT_OTHER)
Admission: RE | Admit: 2022-03-22 | Discharge: 2022-03-22 | Disposition: A | Discharge status: Home / Self Care | Payer: 59 | Source: Ambulatory Visit | Attending: Family Medicine | Admitting: Family Medicine

## 2022-03-22 DIAGNOSIS — M25562 Pain in left knee: Secondary | ICD-10-CM | POA: Insufficient documentation

## 2022-03-22 DIAGNOSIS — M25462 Effusion, left knee: Secondary | ICD-10-CM | POA: Insufficient documentation

## 2022-03-22 DIAGNOSIS — M25532 Pain in left wrist: Secondary | ICD-10-CM | POA: Insufficient documentation

## 2022-04-07 ENCOUNTER — Other Ambulatory Visit: Payer: Self-pay | Admitting: Family Medicine

## 2022-04-07 DIAGNOSIS — Z1231 Encounter for screening mammogram for malignant neoplasm of breast: Secondary | ICD-10-CM

## 2022-04-19 ENCOUNTER — Ambulatory Visit
Admission: RE | Admit: 2022-04-19 | Discharge: 2022-04-19 | Disposition: A | Discharge status: Home / Self Care | Payer: Commercial Managed Care - HMO | Source: Ambulatory Visit | Attending: Family Medicine | Admitting: Family Medicine

## 2022-04-19 DIAGNOSIS — Z1231 Encounter for screening mammogram for malignant neoplasm of breast: Secondary | ICD-10-CM

## 2022-04-21 ENCOUNTER — Ambulatory Visit
Admission: RE | Admit: 2022-04-21 | Discharge: 2022-04-21 | Disposition: A | Discharge status: Home / Self Care | Payer: Commercial Managed Care - HMO | Source: Ambulatory Visit | Attending: Family Medicine | Admitting: Family Medicine

## 2023-02-01 ENCOUNTER — Other Ambulatory Visit: Payer: Self-pay | Admitting: Internal Medicine

## 2023-02-01 DIAGNOSIS — M816 Localized osteoporosis [Lequesne]: Secondary | ICD-10-CM

## 2023-02-08 ENCOUNTER — Encounter: Payer: Self-pay | Admitting: Orthopedic Surgery

## 2023-02-08 ENCOUNTER — Ambulatory Visit (INDEPENDENT_AMBULATORY_CARE_PROVIDER_SITE_OTHER): Payer: Commercial Managed Care - HMO

## 2023-02-08 ENCOUNTER — Ambulatory Visit: Payer: Commercial Managed Care - HMO | Admitting: Orthopedic Surgery

## 2023-02-08 DIAGNOSIS — M79642 Pain in left hand: Secondary | ICD-10-CM

## 2023-02-08 NOTE — Progress Notes (Signed)
Office Visit Note   Patient: Sarah Dyer           Date of Birth: 03/18/1976           MRN: [id number] Visit Date: 02/08/2023              Requested by: Fanny Bien, MD [address] [place],  [place] [address] PCP: Fanny Bien, MD  Chief Complaint  Patient presents with   Left Hand - Pain      HPI: Patient is a 47 year old woman who presents complaining of a painful MCP joint left long finger.  Patient states that she is status post a kidney transplant about 15 years ago and is on antirejection medication.  She states she has been having pain at the MCP joint of the left long finger for 2 months.  Pain is worse with activities.  Assessment & Plan: Visit Diagnoses:  1. Pain in left hand     Plan: Will obtain a uric acid to see if this is contributing to the MCP pain.  Follow-Up Instructions: Return in about 4 weeks (around 03/08/2023).   Ortho Exam  Patient is alert, oriented, no adenopathy, well-dressed, normal affect, normal respiratory effort. Examination patient has good range of motion of all fingers there is no rotational deformity with flexion.  There is no cellulitis there is a little redness over the dorsum of the MCP joint of the long finger.  There is no instability of the joint.  Radiographs do show a slight ulnar variance at the wrist but no tenderness to palpation over the TFCC.  Imaging: XR Wrist 2 Views Left  Result Date: 02/08/2023 2 view radiographs of the left wrist shows a previous metaphyseal distal radius fracture with neutral alignment of the distal radius.  XR Hand Complete Left  Result Date: 02/08/2023 Three-view radiographs of the left hand shows no joint space narrowing no periarticular cystic changes.  No fractures.  No images are attached to the encounter.  Labs: Lab Results  Component Value Date   ESRSEDRATE 10 07/31/2013   REPTSTATUS 07/25/2016 FINAL 07/23/2016   CULT NO GROUP A STREP (S.PYOGENES) ISOLATED  07/23/2016     Lab Results  Component Value Date   ALBUMIN 3.9 04/23/2018   ALBUMIN 2.7 (L) 07/24/2016   ALBUMIN 3.4 (L) 07/23/2016    No results found for: "MG" No results found for: "VD25OH"  No results found for: "PREALBUMIN"    Latest Ref Rng & Units 04/23/2018    7:47 PM 07/24/2016    5:39 AM 07/23/2016    8:15 AM  CBC EXTENDED  WBC 4.0 - 10.5 K/uL 6.8  7.1  8.1   RBC 3.87 - 5.11 MIL/uL 4.17  4.10  4.69   Hemoglobin 12.0 - 15.0 g/dL 12.0  10.6  12.3   HCT 36.0 - 46.0 % 36.1  33.6  38.3   Platelets 150 - 400 K/uL 245  214  236   NEUT# 1.7 - 7.7 K/uL   6.3   Lymph# 0.7 - 4.0 K/uL   0.9      There is no height or weight on file to calculate BMI.  Orders:  Orders Placed This Encounter  Procedures   XR Hand Complete Left   XR Wrist 2 Views Left   Uric acid   No orders of the defined types were placed in this encounter.    Procedures: No procedures performed  Clinical Data: No additional findings.  ROS:  All other systems  negative, except as noted in the HPI. Review of Systems  Objective: Vital Signs: There were no vitals taken for this visit.  Specialty Comments:  No specialty comments available.  PMFS History: Patient Active Problem List   Diagnosis Date Noted   Immunosuppression ([place]) 07/24/2016   SIRS (systemic inflammatory response syndrome) ([place]) 07/23/2016   Epigastric pain 07/31/2013   Abnormal ECG 07/31/2013   Abdominal pain 07/24/2013   Nausea & vomiting 07/24/2013   Gastroenteritis 07/24/2013   History of kidney transplant 07/24/2013   RENAL INSUFFICIENCY, CHRONIC 01/31/2007   Past Medical History:  Diagnosis Date   Chronic kidney disease    Gastritis     No family history on file.  Past Surgical History:  Procedure Laterality Date   CHOLECYSTECTOMY     KIDNEY TRANSPLANT  January2009   TUBAL LIGATION     Social History   Occupational History   Not on file  Tobacco Use   Smoking status: Never   Smokeless tobacco: Never   Substance and Sexual Activity   Alcohol use: No   Drug use: Not on file   Sexual activity: Not on file

## 2023-02-09 ENCOUNTER — Telehealth: Payer: Self-pay

## 2023-02-09 ENCOUNTER — Other Ambulatory Visit: Payer: Self-pay

## 2023-02-09 LAB — URIC ACID: Uric Acid, Serum: 6.7 mg/dL (ref 2.5–7.0)

## 2023-02-09 MED ORDER — ALLOPURINOL 100 MG PO TABS: 100.0000 mg | ORAL_TABLET | Freq: Every day | ORAL | 1 refills | Status: DC

## 2023-02-09 NOTE — Telephone Encounter (Signed)
-----   Message from Newt Minion, MD sent at 02/09/2023  6:46 AM EST ----- Uric acid elevated 6.7.  Have her start allopurinol 100 mg daily. ----- Message ----- From: Interface, [facility] Lab Results In Sent: 02/09/2023   1:17 AM EST To: Newt Minion, MD

## 2023-02-09 NOTE — Telephone Encounter (Signed)
Called and sw pt and daughter to advise of lab results and medication that has been sent to pharm. Voiced understanding and will call with any questions or concerns.

## 2023-03-08 ENCOUNTER — Ambulatory Visit: Payer: Commercial Managed Care - HMO | Admitting: Orthopedic Surgery

## 2023-03-08 DIAGNOSIS — M654 Radial styloid tenosynovitis [de Quervain]: Secondary | ICD-10-CM

## 2023-03-08 DIAGNOSIS — M79642 Pain in left hand: Secondary | ICD-10-CM | POA: Diagnosis not present

## 2023-03-09 ENCOUNTER — Telehealth: Payer: Self-pay

## 2023-03-09 NOTE — Telephone Encounter (Signed)
I called and lm on vm for pt per Dr. Lajoyce Corners to advise to stop taking her allopurinol as there is an interaction with one her other medications and that if she has pain in joint that returns to let us know and we can call in rx for prednisone. To call with any questions.

## 2023-03-11 ENCOUNTER — Encounter: Payer: Self-pay | Admitting: Orthopedic Surgery

## 2023-03-11 NOTE — Progress Notes (Signed)
Office Visit Note   Patient: Sarah Dyer           Date of Birth: [birth year]           MRN: [id number] Visit Date: 03/08/2023              Requested by: Lewis Moccasin, MD [address] [place],  [place] [address] PCP: Lewis Moccasin, MD  Chief Complaint  Patient presents with   Left Hand - Follow-up    Last uric acid 6.7      HPI: Patient is a 47 year old woman who is currently on allopurinol 100 mg once a day.  She is on antirejection medications.  Her last uric acid was 6.7.  Patient complains of pain and swelling left long finger MCP joint.  Radiographs in March were normal.  Patient states she has been wearing a brace and has been having pain over the dorsum of her thumb.  Denies any catching or locking.  Assessment & Plan: Visit Diagnoses:  1. De Quervain's disease (tenosynovitis)     Plan: Patient did not want to consider a injection of the first dorsal extensor compartment.  Will plan for splinting and Voltaren gel.  Repeat uric acid at follow-up  Follow-Up Instructions: Return in about 3 months (around 06/07/2023).   Ortho Exam  Patient is alert, oriented, no adenopathy, well-dressed, normal affect, normal respiratory effort. Examination patient has pain to palpation of the first dorsal extensor compartment.  A Finkelstein's test is painful.  Patient has pain across the MCP joints.  There is no redness no swelling there is no ulnar deviation of the joints.  Imaging: No results found. No images are attached to the encounter.  Labs: Lab Results  Component Value Date   ESRSEDRATE 10 07/31/2013   LABURIC 6.7 02/08/2023   REPTSTATUS 07/25/2016 FINAL 07/23/2016   CULT NO GROUP A STREP (S.PYOGENES) ISOLATED 07/23/2016     Lab Results  Component Value Date   ALBUMIN 3.9 04/23/2018   ALBUMIN 2.7 (L) 07/24/2016   ALBUMIN 3.4 (L) 07/23/2016    No results found for: "MG" No results found for: "VD25OH"  No results found for: "PREALBUMIN"     Latest Ref Rng & Units 04/23/2018    7:47 PM 07/24/2016    5:39 AM 07/23/2016    8:15 AM  CBC EXTENDED  WBC 4.0 - 10.5 K/uL 6.8  7.1  8.1   RBC 3.87 - 5.11 MIL/uL 4.17  4.10  4.69   Hemoglobin 12.0 - 15.0 g/dL 72.0  94.7  09.6   HCT 36.0 - 46.0 % 36.1  33.6  38.3   Platelets 150 - 400 K/uL 245  214  236   NEUT# 1.7 - 7.7 K/uL   6.3   Lymph# 0.7 - 4.0 K/uL   0.9      There is no height or weight on file to calculate BMI.  Orders:  No orders of the defined types were placed in this encounter.  No orders of the defined types were placed in this encounter.    Procedures: No procedures performed  Clinical Data: No additional findings.  ROS:  All other systems negative, except as noted in the HPI. Review of Systems  Objective: Vital Signs: There were no vitals taken for this visit.  Specialty Comments:  No specialty comments available.  PMFS History: Patient Active Problem List   Diagnosis Date Noted   Immunosuppression 07/24/2016   SIRS (systemic inflammatory response syndrome) 07/23/2016   Epigastric  pain 07/31/2013   Abnormal ECG 07/31/2013   Abdominal pain 07/24/2013   Nausea & vomiting 07/24/2013   Gastroenteritis 07/24/2013   History of kidney transplant 07/24/2013   RENAL INSUFFICIENCY, CHRONIC 01/31/2007   Past Medical History:  Diagnosis Date   Chronic kidney disease    Gastritis     History reviewed. No pertinent family history.  Past Surgical History:  Procedure Laterality Date   CHOLECYSTECTOMY     KIDNEY TRANSPLANT  January2009   TUBAL LIGATION     Social History   Occupational History   Not on file  Tobacco Use   Smoking status: Never   Smokeless tobacco: Never  Substance and Sexual Activity   Alcohol use: No   Drug use: Not on file   Sexual activity: Not on file

## 2023-03-23 ENCOUNTER — Emergency Department (HOSPITAL_BASED_OUTPATIENT_CLINIC_OR_DEPARTMENT_OTHER): Payer: Commercial Managed Care - HMO | Admitting: Radiology

## 2023-03-23 ENCOUNTER — Encounter (HOSPITAL_BASED_OUTPATIENT_CLINIC_OR_DEPARTMENT_OTHER): Payer: Self-pay

## 2023-03-23 ENCOUNTER — Emergency Department (HOSPITAL_BASED_OUTPATIENT_CLINIC_OR_DEPARTMENT_OTHER)
Admission: EM | Admit: 2023-03-23 | Discharge: 2023-03-23 | Disposition: A | Discharge status: Home / Self Care | Payer: Commercial Managed Care - HMO | Attending: Emergency Medicine | Admitting: Emergency Medicine

## 2023-03-23 ENCOUNTER — Other Ambulatory Visit: Payer: Self-pay

## 2023-03-23 DIAGNOSIS — J36 Peritonsillar abscess: Secondary | ICD-10-CM | POA: Insufficient documentation

## 2023-03-23 DIAGNOSIS — J029 Acute pharyngitis, unspecified: Secondary | ICD-10-CM | POA: Diagnosis present

## 2023-03-23 LAB — CBC WITH DIFFERENTIAL/PLATELET
Abs Immature Granulocytes: 0.12 10*3/uL — ABNORMAL HIGH (ref 0.00–0.07)
Basophils Absolute: 0 10*3/uL (ref 0.0–0.1)
Basophils Relative: 0 %
Eosinophils Absolute: 0 10*3/uL (ref 0.0–0.5)
Eosinophils Relative: 0 %
HCT: 39.6 % (ref 36.0–46.0)
Hemoglobin: 13.1 g/dL (ref 12.0–15.0)
Immature Granulocytes: 1 %
Lymphocytes Relative: 16 %
Lymphs Abs: 2.2 10*3/uL (ref 0.7–4.0)
MCH: 28.1 pg (ref 26.0–34.0)
MCHC: 33.1 g/dL (ref 30.0–36.0)
MCV: 85 fL (ref 80.0–100.0)
Monocytes Absolute: 1.1 10*3/uL — ABNORMAL HIGH (ref 0.1–1.0)
Monocytes Relative: 8 %
Neutro Abs: 10.2 10*3/uL — ABNORMAL HIGH (ref 1.7–7.7)
Neutrophils Relative %: 75 %
Platelets: 270 10*3/uL (ref 150–400)
RBC: 4.66 MIL/uL (ref 3.87–5.11)
RDW: 14.8 % (ref 11.5–15.5)
WBC: 13.7 10*3/uL — ABNORMAL HIGH (ref 4.0–10.5)
nRBC: 0 % (ref 0.0–0.2)

## 2023-03-23 LAB — BASIC METABOLIC PANEL
Anion gap: 11 (ref 5–15)
BUN: 24 mg/dL — ABNORMAL HIGH (ref 6–20)
CO2: 26 mmol/L (ref 22–32)
Calcium: 9.7 mg/dL (ref 8.9–10.3)
Chloride: 103 mmol/L (ref 98–111)
Creatinine, Ser: 1.2 mg/dL — ABNORMAL HIGH (ref 0.44–1.00)
GFR, Estimated: 57 mL/min — ABNORMAL LOW (ref >60)
Glucose, Bld: 112 mg/dL — ABNORMAL HIGH (ref 70–99)
Potassium: 3.9 mmol/L (ref 3.5–5.1)
Sodium: 140 mmol/L (ref 135–145)

## 2023-03-23 LAB — GROUP A STREP BY PCR: Group A Strep by PCR: NOT DETECTED

## 2023-03-23 MED ORDER — SODIUM CHLORIDE 0.9 % IV BOLUS
1000.0000 mL | Freq: Once | INTRAVENOUS | Status: AC
  Administered 2023-03-23: 1000 mL via INTRAVENOUS

## 2023-03-23 MED ORDER — AMOXICILLIN-POT CLAVULANATE 875-125 MG PO TABS: 1.0000 | ORAL_TABLET | Freq: Two times a day (BID) | ORAL | 0 refills | Status: DC

## 2023-03-23 MED ORDER — DEXAMETHASONE SODIUM PHOSPHATE 10 MG/ML IJ SOLN
10.0000 mg | Freq: Once | INTRAMUSCULAR | Status: AC
  Administered 2023-03-23: 10 mg via INTRAVENOUS
  Filled 2023-03-23: qty 1

## 2023-03-23 MED ORDER — OXYCODONE HCL 5 MG PO TABS: 5.0000 mg | ORAL_TABLET | Freq: Four times a day (QID) | ORAL | 0 refills | Status: DC | PRN

## 2023-03-23 MED ORDER — KETOROLAC TROMETHAMINE 15 MG/ML IJ SOLN
15.0000 mg | Freq: Once | INTRAMUSCULAR | Status: AC
  Administered 2023-03-23: 15 mg via INTRAVENOUS
  Filled 2023-03-23: qty 1

## 2023-03-23 MED ORDER — SODIUM CHLORIDE 0.9 % IV SOLN
3.0000 g | Freq: Once | INTRAVENOUS | Status: AC
  Administered 2023-03-23: 3 g via INTRAVENOUS

## 2023-03-23 NOTE — ED Triage Notes (Signed)
Pt c/o sore throat, cough, fever x1wk. PCP yesterday, tested negative for covid/ flu/ strep. Pt given steroid injection, advised it could be allergies. Here r/t increased pain in throat.

## 2023-03-23 NOTE — ED Notes (Signed)
RN provided AVS using Teachback Method. Patient verbalizes understanding of Discharge Instructions. Opportunity for Questioning and Answers were provided by RN. Patient Discharged from ED ambulatory to Home with family.  

## 2023-03-23 NOTE — ED Notes (Signed)
ED PA at the Bedside.

## 2023-03-23 NOTE — Discharge Instructions (Signed)
Please read and follow all provided instructions.  Your diagnoses today include:  1. Peritonsillar abscess     Tests performed today include: Strep test: was negative for sore throat Blood cell counts electrolytes: Your infection fighting cell count was high Chest x-ray did not show any signs of pneumonia Vital signs. See below for your results today.   Medications prescribed:  Augmentin - antibiotic  You have been prescribed an antibiotic medicine: take the entire course of medicine even if you are feeling better. Stopping early can cause the antibiotic not to work.  Oxycodone - narcotic pain medication  DO NOT drive or perform any activities that require you to be awake and alert because this medicine can make you drowsy.   Please use over-the-counter NSAID medications (ibuprofen, naproxen) or Tylenol (acetaminophen) as directed on the packaging for pain -- as long as you do not have any reasons avoid these medications. Reasons to avoid NSAID medications include: weak kidneys, a history of bleeding in your stomach or gut, or uncontrolled high blood pressure or previous heart attack. Reasons to avoid Tylenol include: liver problems or ongoing alcohol use. Never take more than  or 8 Extra strength Tylenol in a 24 hour period.     Take any medications prescribed only as directed.   Home care instructions:  Please read the educational materials provided and follow any instructions contained in this packet.  Follow-up instructions: Please call the throat doctor listed on Monday to establish a follow-up appointment.  Return instructions:  Please return to the Emergency Department if you experience worsening symptoms.  Return if you have worsening problems swallowing, your neck becomes swollen, you cannot swallow your saliva or your voice becomes muffled.  Return with high persistent fever, persistent vomiting, or if you have trouble breathing.  Please return if you have any other  emergent concerns.  Additional Information:  Your vital signs today were: BP (!) 144/80 (BP Location: Right Arm)   Pulse 70   Temp 98.6 F (37 C) (Oral)   Resp 18   Ht 5' (1.524 m)   Wt 59.4 kg   SpO2 99%   BMI 25.58 kg/m  If your blood pressure (BP) was elevated above 135/85 this visit, please have this repeated by your doctor within one month. --------------

## 2023-03-23 NOTE — ED Provider Notes (Signed)
[place] EMERGENCY DEPARTMENT AT [facility] Provider Note   CSN: [id number] Arrival date & time: 03/23/23  1532     History  Chief Complaint  Patient presents with   Sore Throat         Sarah Dyer is a 47 y.o. female.   with a PMHx of renal transplant on immunosuppressant therapy (cellcept and sandimmune) who presents to the ED for throat pain.   Patient reports 1 week onset of productive cough with green sputum and wheezing. She was seen by her PCP yesterday and tested negative for covid/strep/flu. She was provided with a IM steroid for symptomatic treatment. She was also Rx Albuterol and Benzonatate which she has not used. She felt an initial improvement of cough/wheezing after steroid but reports acute onset last night of left sided throat pain. Pain radiates from L nose, to left ear, down to left throat. Endorses associated dysphonia and dysphagia 2/2 pain and feeling like there is a "ball" in her throat. Has tired tylenol and Advil without pain relief. No + sick contacts.   Denies associated SOB, edema, fevers, chills, abdominal pain, arthralgias, myalgias, scalp pain, headaches, or vision changes.   Patient also mentioned cough with chest pain exacerbated by coughing.       Home Medications Prior to Admission medications   Medication Sig Start Date End Date Taking? Authorizing Provider  allopurinol (ZYLOPRIM) 100 MG tablet Take 1 tablet (100 mg total) by mouth daily. 02/09/23   Nadara Mustard, MD  acetaminophen (TYLENOL) 500 MG tablet Take 500 mg by mouth every 6 (six) hours as needed for pain.    [provider]  ciprofloxacin (CIPRO) 500 MG tablet Take 1 tablet (500 mg total) by mouth 2 (two) times daily. Patient not taking: Reported on 04/23/2018 07/24/16   Rama, Maryruth Bun, MD  cycloSPORINE (SANDIMMUNE) 100 MG/ML solution Take 90 mg by mouth 2 (two) times daily.    [provider]  metroNIDAZOLE (FLAGYL) 500 MG tablet Take 1  tablet (500 mg total) by mouth 3 (three) times daily. Patient not taking: Reported on 04/23/2018 07/24/16   Rama, Maryruth Bun, MD  mycophenolate (CELLCEPT) 500 MG tablet Take 500 mg by mouth 3 (three) times daily.    [provider]  omeprazole (PRILOSEC) 20 MG capsule Take 1 capsule (20 mg total) by mouth 2 (two) times daily. 08/07/18   Dahlia Byes A, NP  predniSONE (DELTASONE) 5 MG tablet Take 5 mg by mouth daily.    [provider]  ranitidine (ZANTAC) 150 MG capsule Take 1 capsule (150 mg total) by mouth daily. 08/07/18   Janace Aris, NP      Allergies    Patient has no known allergies.    Review of Systems   Review of Systems  Physical Exam Updated Vital Signs BP 115/66   Pulse 78   Temp 98.6 F (37 C) (Oral)   Resp 16   Ht 5' (1.524 m)   Wt 59.4 kg   SpO2 98%   BMI 25.58 kg/m   Physical Exam Vitals and nursing note reviewed.  Constitutional:      Appearance: She is well-developed.  HENT:     Head: Normocephalic and atraumatic.     Jaw: No trismus.     Right Ear: Tympanic membrane, ear canal and external ear normal.     Left Ear: Tympanic membrane, ear canal and external ear normal.     Nose: Nose normal. No mucosal edema or rhinorrhea.  Mouth/Throat:     Mouth: Mucous membranes are moist. Mucous membranes are not dry. No oral lesions.     Pharynx: Uvula midline. No oropharyngeal exudate, posterior oropharyngeal erythema or uvula swelling.     Tonsils: Tonsillar abscess present. No tonsillar exudate.     Comments: Patient with peritonsillar fullness on the left with mild uvular deviation to the right side.  Patient's voice is not hoarse or muffled.  She is tolerating her secretions. Eyes:     General:        Right eye: No discharge.        Left eye: No discharge.     Conjunctiva/sclera: Conjunctivae normal.  Cardiovascular:     Rate and Rhythm: Normal rate and regular rhythm.     Heart sounds: Normal heart sounds.  Pulmonary:     Effort:  Pulmonary effort is normal. No respiratory distress.     Breath sounds: Normal breath sounds. No wheezing or rales.  Abdominal:     Palpations: Abdomen is soft.     Tenderness: There is no abdominal tenderness.  Musculoskeletal:     Cervical back: Normal range of motion and neck supple.  Lymphadenopathy:     Cervical: No cervical adenopathy.  Skin:    General: Skin is warm and dry.  Neurological:     Mental Status: She is alert.  Psychiatric:        Mood and Affect: Mood normal.     ED Results / Procedures / Treatments   Labs (all labs ordered are listed, but only abnormal results are displayed) Labs Reviewed  CBC WITH DIFFERENTIAL/PLATELET - Abnormal; Notable for the following components:      Result Value   WBC 13.7 (*)    Neutro Abs 10.2 (*)    Monocytes Absolute 1.1 (*)    Abs Immature Granulocytes 0.12 (*)    All other components within normal limits  BASIC METABOLIC PANEL - Abnormal; Notable for the following components:   Glucose, Bld 112 (*)    BUN 24 (*)    Creatinine, Ser 1.20 (*)    GFR, Estimated 57 (*)    All other components within normal limits  GROUP A STREP BY PCR    EKG None  Radiology DG Chest 2 View  Result Date: 03/23/2023 CLINICAL DATA:  Chest pain and cough. EXAM: CHEST - 2 VIEW COMPARISON:  Chest radiograph dated 07/23/2016. FINDINGS: No focal consolidation, pleural effusion, or pneumothorax. The cardiac silhouette is within normal limits. No acute osseous pathology. IMPRESSION: No active cardiopulmonary disease. Electronically Signed   By: Elgie Collard M.D.   On: 03/23/2023 18:16    Procedures Procedures    Medications Ordered in ED Medications  Ampicillin-Sulbactam (UNASYN) 3 g in sodium chloride 0.9 % 100 mL IVPB (3 g Intravenous New Bag/Given 03/23/23 1806)  sodium chloride 0.9 % bolus 1,000 mL (1,000 mLs Intravenous New Bag/Given 03/23/23 1805)  dexamethasone (DECADRON) injection 10 mg (10 mg Intravenous Given 03/23/23 1754)   ketorolac (TORADOL) 15 MG/ML injection 15 mg (15 mg Intravenous Given 03/23/23 1755)    ED Course/ Medical Decision Making/ A&P Clinical Course as of 03/23/23 1758  Fri Mar 23, 2023  1750 Sore throat.  Immunosuppressed. Possible small PTA. Trialing ABX and steroids tonight.  If progressing might need procedural intervention but not currently indicated. [CC]    Clinical Course User Index [CC] Glyn Ade, MD    Patient seen and examined.  Discussed with Dr. Doran Durand.  History obtained directly from patient.  She has concerning findings for small left-sided peritonsillar abscess or peritonsillar cellulitis.  Overall she looks well, nontoxic.  Given history of immunocompromise will check labs and give IV fluids, IV antibiotic.  Labs/EKG: Ordered CBC, BMP.  Repeat strep testing was negative.  Imaging: None ordered.  Considered CT soft tissue of the neck to further evaluate for peritonsillar abscess, however no significant complications noted at this point and patient will be treated with plan for follow-up with ENT.  Medications/Fluids: Ordered: IV fluid bolus, IV Unasyn, IV Decadron, IV Toradol.   Most recent vital signs reviewed and are as follows: BP (!) 144/80 (BP Location: Right Arm)   Pulse 70   Temp 98.6 F (37 C) (Oral)   Resp 18   Ht 5' (1.524 m)   Wt 59.4 kg   SpO2 99%   BMI 25.58 kg/m   Initial impression: Sore throat with developing left-sided peritonsillar abscess as above.  6:51 PM Reassessment performed. Patient appears improved.  She states that her pain is better and she is feeling well.  Labs personally reviewed and interpreted including: CBC with elevated white blood cell count at 13.7 likely due to infection and recent administration of steroid medication; BMP with minimally elevated creatinine at 1.20 otherwise unremarkable; strep test negative.  Imaging personally visualized and interpreted including: Chest x-ray agree negative.  Reviewed  pertinent lab work and imaging with patient at bedside. Questions answered.   Most current vital signs reviewed and are as follows: BP (!) 144/80 (BP Location: Right Arm)   Pulse 70   Temp 98.6 F (37 C) (Oral)   Resp 18   Ht 5' (1.524 m)   Wt 59.4 kg   SpO2 99%   BMI 25.58 kg/m   Plan: Discharge to home.   Prescriptions written for: Augmentin, oxycodone.  Also recommended OTC NSAIDs.  Patient counseled on use of narcotic pain medications. Counseled not to combine these medications with others containing tylenol. Urged not to drink alcohol, drive, or perform any other activities that requires focus while taking these medications. The patient verbalizes understanding and agrees with the plan.  Other home care instructions discussed: Dietary adjustment, soft foods and maintain good hydration  ED return instructions discussed: Worsening difficulty swallowing, high persistent fever, persistent vomiting, new symptoms or other concerns.  Follow-up instructions discussed: Patient encouraged to follow-up with ENT referral for follow-up early next week.                           Medical Decision Making Amount and/or Complexity of Data Reviewed Labs: ordered. Radiology: ordered.  Risk Prescription drug management.   In regards to the patient's sore throat today, the following dangerous and potentially life threatening etiologies were considered on the differential diagnosis: Lugwig's angina, uvulitis, epiglottis, peritonsillar abscess, retropharyngeal abscess, Lemierre's syndrome. Also considered were more common causes such as: streptococcal pharyngitis, gonococcal pharyngitis, non-bacterial pharyngitis (cold viruses, HSV/coxsackievirus, influenza, COVID-19, infectious mononucleosis, oropharyngeal candidiasis), and other non-infectious causes including seasonal allergies/post-nasal drip, GERD/esophagitis, trauma.   Clinically patient with left-sided peritonsillar abscess/cellulitis.  She  is maintaining her airway well.  This was treated in the emergency department with improvement in symptoms.  She is not febrile.  She is higher risk due to her history of immunocompromise.  Do not feel that advanced imaging or emergent ENT follow-up is indicated at this time.  The patient's vital signs, pertinent lab work and imaging were reviewed and interpreted as discussed in the ED course.  Hospitalization was considered for further testing, treatments, or serial exams/observation. However as patient is well-appearing, has a stable exam, and reassuring studies today, I do not feel that they warrant admission at this time. This plan was discussed with the patient who verbalizes agreement and comfort with this plan and seems reliable and able to return to the Emergency Department with worsening or changing symptoms.          Final Clinical Impression(s) / ED Diagnoses Final diagnoses:  Peritonsillar abscess    Rx / DC Orders ED Discharge Orders          Ordered    amoxicillin-clavulanate (AUGMENTIN) 875-125 MG tablet  Every 12 hours        03/23/23 1847    oxyCODONE (OXY IR/ROXICODONE) 5 MG immediate release tablet  Every 6 hours PRN        03/23/23 1847              Renne Crigler, PA-C 03/23/23 1853    Glyn Ade, MD 03/26/23 [phone]

## 2023-05-15 ENCOUNTER — Other Ambulatory Visit: Payer: Self-pay | Admitting: Family Medicine

## 2023-05-15 DIAGNOSIS — Z1231 Encounter for screening mammogram for malignant neoplasm of breast: Secondary | ICD-10-CM

## 2023-05-31 ENCOUNTER — Encounter: Payer: Self-pay | Admitting: Family Medicine

## 2023-05-31 ENCOUNTER — Ambulatory Visit
Admission: RE | Admit: 2023-05-31 | Discharge: 2023-05-31 | Disposition: A | Discharge status: Home / Self Care | Payer: Commercial Managed Care - HMO | Source: Ambulatory Visit | Attending: Family Medicine | Admitting: Family Medicine

## 2023-05-31 DIAGNOSIS — Z1231 Encounter for screening mammogram for malignant neoplasm of breast: Secondary | ICD-10-CM

## 2023-06-11 ENCOUNTER — Ambulatory Visit: Payer: Commercial Managed Care - HMO | Admitting: Orthopedic Surgery

## 2023-07-26 ENCOUNTER — Other Ambulatory Visit: Payer: Self-pay | Admitting: Family Medicine

## 2023-07-26 DIAGNOSIS — M816 Localized osteoporosis [Lequesne]: Secondary | ICD-10-CM

## 2023-07-27 ENCOUNTER — Encounter (HOSPITAL_COMMUNITY): Payer: Self-pay | Admitting: *Deleted

## 2023-07-27 ENCOUNTER — Observation Stay (HOSPITAL_BASED_OUTPATIENT_CLINIC_OR_DEPARTMENT_OTHER)
Admission: EM | Admit: 2023-07-27 | Discharge: 2023-07-29 | Disposition: A | Discharge status: Home / Self Care | Payer: Commercial Managed Care - HMO | Attending: Internal Medicine | Admitting: Internal Medicine

## 2023-07-27 ENCOUNTER — Ambulatory Visit (HOSPITAL_COMMUNITY)
Admission: EM | Admit: 2023-07-27 | Discharge: 2023-07-27 | Disposition: A | Discharge status: Home / Self Care | Payer: Commercial Managed Care - HMO | Source: Home / Self Care

## 2023-07-27 ENCOUNTER — Encounter (HOSPITAL_BASED_OUTPATIENT_CLINIC_OR_DEPARTMENT_OTHER): Payer: Self-pay

## 2023-07-27 ENCOUNTER — Other Ambulatory Visit: Payer: Self-pay

## 2023-07-27 ENCOUNTER — Emergency Department (HOSPITAL_BASED_OUTPATIENT_CLINIC_OR_DEPARTMENT_OTHER): Payer: Commercial Managed Care - HMO

## 2023-07-27 DIAGNOSIS — Z1152 Encounter for screening for COVID-19: Secondary | ICD-10-CM | POA: Insufficient documentation

## 2023-07-27 DIAGNOSIS — E785 Hyperlipidemia, unspecified: Secondary | ICD-10-CM

## 2023-07-27 DIAGNOSIS — N183 Chronic kidney disease, stage 3 unspecified: Secondary | ICD-10-CM | POA: Diagnosis not present

## 2023-07-27 DIAGNOSIS — D849 Immunodeficiency, unspecified: Secondary | ICD-10-CM | POA: Diagnosis present

## 2023-07-27 DIAGNOSIS — Z940 Kidney transplant status: Secondary | ICD-10-CM

## 2023-07-27 DIAGNOSIS — R1013 Epigastric pain: Secondary | ICD-10-CM

## 2023-07-27 DIAGNOSIS — K2900 Acute gastritis without bleeding: Secondary | ICD-10-CM

## 2023-07-27 DIAGNOSIS — R7401 Elevation of levels of liver transaminase levels: Principal | ICD-10-CM | POA: Diagnosis present

## 2023-07-27 DIAGNOSIS — Z79899 Other long term (current) drug therapy: Secondary | ICD-10-CM | POA: Insufficient documentation

## 2023-07-27 DIAGNOSIS — R110 Nausea: Secondary | ICD-10-CM

## 2023-07-27 LAB — CBC WITH DIFFERENTIAL/PLATELET
Abs Immature Granulocytes: 0.08 10*3/uL — ABNORMAL HIGH (ref 0.00–0.07)
Basophils Absolute: 0 10*3/uL (ref 0.0–0.1)
Basophils Relative: 1 %
Eosinophils Absolute: 0.1 10*3/uL (ref 0.0–0.5)
Eosinophils Relative: 1 %
HCT: 37.9 % (ref 36.0–46.0)
Hemoglobin: 12 g/dL (ref 12.0–15.0)
Immature Granulocytes: 1 %
Lymphocytes Relative: 7 %
Lymphs Abs: 0.6 10*3/uL — ABNORMAL LOW (ref 0.7–4.0)
MCH: 27.1 pg (ref 26.0–34.0)
MCHC: 31.7 g/dL (ref 30.0–36.0)
MCV: 85.6 fL (ref 80.0–100.0)
Monocytes Absolute: 0.5 10*3/uL (ref 0.1–1.0)
Monocytes Relative: 5 %
Neutro Abs: 7.6 10*3/uL (ref 1.7–7.7)
Neutrophils Relative %: 85 %
Platelets: 227 10*3/uL (ref 150–400)
RBC: 4.43 MIL/uL (ref 3.87–5.11)
RDW: 14.1 % (ref 11.5–15.5)
WBC: 8.8 10*3/uL (ref 4.0–10.5)
nRBC: 0 % (ref 0.0–0.2)

## 2023-07-27 LAB — COMPREHENSIVE METABOLIC PANEL
ALT: 413 U/L — ABNORMAL HIGH (ref 0–44)
ALT: 391 U/L — ABNORMAL HIGH (ref 0–44)
AST: 850 U/L — ABNORMAL HIGH (ref 15–41)
AST: 593 U/L — ABNORMAL HIGH (ref 15–41)
Albumin: 3.7 g/dL (ref 3.5–5.0)
Albumin: 3.9 g/dL (ref 3.5–5.0)
Alkaline Phosphatase: 132 U/L — ABNORMAL HIGH (ref 38–126)
Alkaline Phosphatase: 126 U/L (ref 38–126)
Anion gap: 15 (ref 5–15)
Anion gap: 9 (ref 5–15)
BUN: 29 mg/dL — ABNORMAL HIGH (ref 6–20)
BUN: 32 mg/dL — ABNORMAL HIGH (ref 6–20)
CO2: 20 mmol/L — ABNORMAL LOW (ref 22–32)
CO2: 24 mmol/L (ref 22–32)
Calcium: 9 mg/dL (ref 8.9–10.3)
Calcium: 9 mg/dL (ref 8.9–10.3)
Chloride: 104 mmol/L (ref 98–111)
Chloride: 103 mmol/L (ref 98–111)
Creatinine, Ser: 1.49 mg/dL — ABNORMAL HIGH (ref 0.44–1.00)
Creatinine, Ser: 1.43 mg/dL — ABNORMAL HIGH (ref 0.44–1.00)
GFR, Estimated: 44 mL/min — ABNORMAL LOW (ref >60)
GFR, Estimated: 46 mL/min — ABNORMAL LOW (ref >60)
Glucose, Bld: 123 mg/dL — ABNORMAL HIGH (ref 70–99)
Glucose, Bld: 117 mg/dL — ABNORMAL HIGH (ref 70–99)
Potassium: 3.5 mmol/L (ref 3.5–5.1)
Potassium: 3.9 mmol/L (ref 3.5–5.1)
Sodium: 139 mmol/L (ref 135–145)
Sodium: 136 mmol/L (ref 135–145)
Total Bilirubin: 1.2 mg/dL (ref 0.3–1.2)
Total Bilirubin: 1.3 mg/dL — ABNORMAL HIGH (ref 0.3–1.2)
Total Protein: 6.6 g/dL (ref 6.5–8.1)
Total Protein: 6.2 g/dL — ABNORMAL LOW (ref 6.5–8.1)

## 2023-07-27 LAB — POCT URINALYSIS DIP (MANUAL ENTRY)
Blood, UA: NEGATIVE
Glucose, UA: NEGATIVE mg/dL
Leukocytes, UA: NEGATIVE
Nitrite, UA: NEGATIVE
Protein Ur, POC: NEGATIVE mg/dL
Spec Grav, UA: 1.02 (ref 1.010–1.025)
Urobilinogen, UA: 0.2 E.U./dL
pH, UA: 5 (ref 5.0–8.0)

## 2023-07-27 LAB — LIPASE, BLOOD: Lipase: 27 U/L (ref 11–51)

## 2023-07-27 MED ORDER — ACETAMINOPHEN 325 MG PO TABS
650.0000 mg | ORAL_TABLET | Freq: Once | ORAL | Status: AC
  Administered 2023-07-27: 650 mg via ORAL

## 2023-07-27 MED ORDER — ALUM & MAG HYDROXIDE-SIMETH 200-200-20 MG/5ML PO SUSP
ORAL | Status: AC
  Filled 2023-07-27: qty 30

## 2023-07-27 MED ORDER — ACETAMINOPHEN 325 MG PO TABS
ORAL_TABLET | ORAL | Status: AC
  Filled 2023-07-27: qty 2

## 2023-07-27 MED ORDER — ONDANSETRON 4 MG PO TBDP
4.0000 mg | ORAL_TABLET | Freq: Once | ORAL | Status: AC
  Administered 2023-07-27: 4 mg via ORAL

## 2023-07-27 MED ORDER — ONDANSETRON 4 MG PO TBDP
ORAL_TABLET | ORAL | Status: AC
  Filled 2023-07-27: qty 1

## 2023-07-27 MED ORDER — SUCRALFATE 1 GM/10ML PO SUSP
1.0000 g | Freq: Three times a day (TID) | ORAL | 0 refills | Status: AC
Start: 2023-07-27 — End: ?

## 2023-07-27 MED ORDER — ALUM & MAG HYDROXIDE-SIMETH 200-200-20 MG/5ML PO SUSP
30.0000 mL | Freq: Once | ORAL | Status: AC
  Administered 2023-07-27: 30 mL via ORAL

## 2023-07-27 MED ORDER — LACTATED RINGERS IV BOLUS
1000.0000 mL | Freq: Once | INTRAVENOUS | Status: AC
  Administered 2023-07-27: 1000 mL via INTRAVENOUS

## 2023-07-27 MED ORDER — ONDANSETRON 4 MG PO TBDP: 4.0000 mg | ORAL_TABLET | Freq: Three times a day (TID) | ORAL | 0 refills | Status: DC | PRN

## 2023-07-27 MED ORDER — SUCRALFATE 1 G PO TABS: 1.0000 g | ORAL_TABLET | Freq: Three times a day (TID) | ORAL | 0 refills | Status: DC

## 2023-07-27 MED ORDER — ACETAMINOPHEN 500 MG PO TABS: 1000.0000 mg | ORAL_TABLET | Freq: Once | ORAL | Status: DC

## 2023-07-27 NOTE — ED Provider Notes (Signed)
[place] EMERGENCY DEPARTMENT AT [facility] Provider Note   CSN: [id number] Arrival date & time: 07/27/23  2009     History  Chief Complaint  Patient presents with   Abnormal Lab    Liver enzymes    Sarah Dyer is a 47 y.o. female.  Patient is a 47 year old female with a history of renal transplant in 2009, gastritis, status postcholecystectomy who is presenting today due to abnormal labs from urgent care.  Patient reports that today she started having upper abdominal pain that radiated into the back, some nausea, headache and chills.  She had a normal bowel movement this morning has not had any diarrhea.  Denies any urinary symptoms.  At urgent care she was given a GI cocktail and Tylenol.  She reports overall that did help with her abdominal discomfort and now its almost gone but she continues to have the headache.  Still has not noticed any fever.  However they did blood work at urgent care and they were called after she had left and reported that her LFTs were elevated and she should seek care.  Patient has had a prior cholecystectomy denies any alcohol or excessive Tylenol use.  She is unaware of any bad food exposure.  Nobody else is sick at her home.  She has not missed any of her medications.  The history is provided by the patient. The history is limited by a language barrier. A language interpreter was used.  Abnormal Lab      Home Medications Prior to Admission medications   Medication Sig Start Date End Date Taking? Authorizing Provider  acetaminophen (TYLENOL) 500 MG tablet Take 500 mg by mouth every 6 (six) hours as needed for pain.    [provider]  allopurinol (ZYLOPRIM) 100 MG tablet Take 1 tablet (100 mg total) by mouth daily. 02/09/23   Nadara Mustard, MD  amoxicillin-clavulanate (AUGMENTIN) 875-125 MG tablet Take 1 tablet by mouth every 12 (twelve) hours. 03/23/23   Renne Crigler, PA-C  cycloSPORINE (SANDIMMUNE) 100 MG/ML solution  Take 90 mg by mouth 2 (two) times daily.    [provider]  famotidine (PEPCID) 40 MG tablet Take 40 mg by mouth daily. 07/10/23   [provider]  magnesium oxide (MAG-OX) 400 MG tablet Take by mouth. 10/21/19   [provider]  mycophenolate (CELLCEPT) 500 MG tablet Take 500 mg by mouth 3 (three) times daily.    [provider]  ondansetron (ZOFRAN-ODT) 4 MG disintegrating tablet Take 1 tablet (4 mg total) by mouth every 8 (eight) hours as needed for nausea or vomiting. 07/27/23   Raspet, Denny Peon K, PA-C  predniSONE (DELTASONE) 5 MG tablet Take 5 mg by mouth daily.    [provider]  sertraline (ZOLOFT) 50 MG tablet Take 50 mg by mouth daily. 07/10/23   [provider]  simvastatin (ZOCOR) 20 MG tablet Take 20 mg by mouth at bedtime. 07/26/23   [provider]  sucralfate (CARAFATE) 1 GM/10ML suspension Take 10 mLs (1 g total) by mouth 4 (four) times daily -  with meals and at bedtime. 07/27/23   Raspet, Noberto Retort, PA-C      Allergies    Cephalosporins    Review of Systems   Review of Systems  Physical Exam Updated Vital Signs BP 132/73   Pulse 65   Temp 98.9 F (37.2 C) (Oral)   Resp 16   Ht 5' (1.524 m)   Wt 59.4 kg   LMP  07/23/2016   SpO2 100%   BMI 25.58 kg/m  Physical Exam Vitals and nursing note reviewed.  Constitutional:      General: She is not in acute distress.    Appearance: She is well-developed.  HENT:     Head: Normocephalic and atraumatic.  Eyes:     Pupils: Pupils are equal, round, and reactive to light.  Cardiovascular:     Rate and Rhythm: Normal rate and regular rhythm.     Heart sounds: Normal heart sounds. No murmur heard.    No friction rub.  Pulmonary:     Effort: Pulmonary effort is normal.     Breath sounds: Normal breath sounds. No wheezing or rales.  Abdominal:     General: Bowel sounds are normal. There is no distension.     Palpations: Abdomen is soft.     Tenderness: There is no  abdominal tenderness. There is no guarding or rebound.  Musculoskeletal:        General: No tenderness. Normal range of motion.     Comments: No edema  Skin:    General: Skin is warm and dry.     Findings: No rash.  Neurological:     Mental Status: She is alert and oriented to person, place, and time.     Cranial Nerves: No cranial nerve deficit.  Psychiatric:        Behavior: Behavior normal.     ED Results / Procedures / Treatments   Labs (all labs ordered are listed, but only abnormal results are displayed) Labs Reviewed  COMPREHENSIVE METABOLIC PANEL - Abnormal; Notable for the following components:      Result Value   Glucose, Bld 117 (*)    BUN 32 (*)    Creatinine, Ser 1.43 (*)    Total Protein 6.2 (*)    AST 593 (*)    ALT 391 (*)    Total Bilirubin 1.3 (*)    GFR, Estimated 46 (*)    All other components within normal limits  HEPATITIS PANEL, ACUTE    EKG None  Radiology CT ABDOMEN PELVIS WO CONTRAST  Result Date: 07/27/2023 CLINICAL DATA:  Abdominal pain and elevated liver enzymes, initial encounter EXAM: CT ABDOMEN AND PELVIS WITHOUT CONTRAST TECHNIQUE: Multidetector CT imaging of the abdomen and pelvis was performed following the standard protocol without IV contrast. RADIATION DOSE REDUCTION: This exam was performed according to the departmental dose-optimization program which includes automated exposure control, adjustment of the mA and/or kV according to patient size and/or use of iterative reconstruction technique. COMPARISON:  None Available. FINDINGS: Lower chest: No acute abnormality. Hepatobiliary: No focal liver abnormality is seen. Status post cholecystectomy. No biliary dilatation. Pancreas: Unremarkable. No pancreatic ductal dilatation or surrounding inflammatory changes. Spleen: Normal in size without focal abnormality. Adrenals/Urinary Tract: Adrenal glands are within normal limits. Native kidneys are shrunken consistent with the end-stage renal  disease. Renal transplant is noted in the right iliac fossa. No obstructive changes are seen. Bladder is well distended. Stomach/Bowel: No obstructive or inflammatory changes of the colon are noted. The appendix is within normal limits. Small bowel and stomach are unremarkable. Vascular/Lymphatic: Aortic atherosclerosis. No enlarged abdominal or pelvic lymph nodes. Reproductive: Uterus and bilateral adnexa are unremarkable. Other: No abdominal wall hernia or abnormality. No abdominopelvic ascites. Musculoskeletal: No acute or significant osseous findings. IMPRESSION: Chronic changes of end-stage renal disease with right renal transplant. No acute abnormality noted. Electronically Signed   By: Alcide Clever M.D.   On: 07/27/2023 22:14  Procedures Procedures    Medications Ordered in ED Medications  lactated ringers bolus 1,000 mL (0 mLs Intravenous Stopped 07/27/23 2307)    ED Course/ Medical Decision Making/ A&P                                 Medical Decision Making Amount and/or Complexity of Data Reviewed Labs: ordered. Decision-making details documented in ED Course. Radiology: ordered and independent interpretation performed. Decision-making details documented in ED Course.  Risk Decision regarding hospitalization.   Pt with multiple medical problems and comorbidities and presenting today with a complaint that caries a high risk for morbidity and mortality.  Here today with abdominal pain that started earlier today which is now improved but ongoing headache.  Patient did had some nausea earlier but denied any vomiting has not had any diarrhea.  Lower suspicion for acute hepatitis as patient has had no vomiting or diarrhea.  She has been afebrile.  Concern for bile duct obstruction, pancreatitis, viral etiology.  Patient is immune compromised which is increases her risk.  At urgent care patient's LFTs with an AST of 850 and ALT of 413 but a normal bilirubin of 1.2.  Also mild bump in her  creatinine today of 1.49 from her baseline of 1.2.  Patient has not had much to eat today will give IV fluids.  Will repeat to ensure these labs are right as patient does not use alcohol, excessive Tylenol and has not had any change in her medications.  No prior history of ever having elevated LFTs.  Will also get a scan to ensure no masses, liver abscesses.  Lower suspicion for bile duct obstruction as her total bilirubin is normal and no evidence of pancreatitis with normal lipase today. 11:50 PM Repeat LFTs today are improving but still elevated.  Spoke with Dr. Bosie Clos who recommended the patient be observed with ongoing hydration and recheck the LFTs tomorrow at this time unclear the cause.  Discussed this with the patient and her daughter.  They are comfortable with this plan.        Final Clinical Impression(s) / ED Diagnoses Final diagnoses:  Transaminitis    Rx / DC Orders ED Discharge Orders     None         Gwyneth Sprout, MD 07/27/23 2351

## 2023-07-27 NOTE — ED Triage Notes (Signed)
Pts daughter states pt had a kidney transplant in the past so when she started not feeling well this morning they brought her.    She complains of nausea, abdominal pain (mid), body aches, headache, chills. She states temp this morning was 99.4, but she had chills. She took tylenol at 8:30am

## 2023-07-27 NOTE — ED Notes (Signed)
Assumed care of this patient at 2300. Patient sitting comfortably with daughter; denies any needs. VSS

## 2023-07-27 NOTE — ED Triage Notes (Addendum)
Pt had chills and body aches this morning. Pt went to UC and her liver enzymes were elevated and she was told to come here. Pt has history of kidney transplant 2009. Pain now is 8/10 headache. Abd pain is better  at this time.

## 2023-07-27 NOTE — ED Provider Notes (Signed)
[facility]    CSN: [id number] Arrival date & time: 07/27/23  1340      History   Chief Complaint Chief Complaint  Patient presents with   Abdominal Pain   Headache   Chills   Generalized Body Aches    HPI Sarah Dyer is a 47 y.o. female.   Patient presents today companied by her daughter who provide the majority of history as well as translation.  Reports that beginning earlier today she developed epigastric abdominal pain, chills, headache, body aches.  She reports nausea but denies any vomiting, changes in bowel habits, melena, hematochezia.  Her last bowel movement was earlier today and was normal.  She does have a history of gastritis and believes that this is causing her symptoms.  She denies any increased NSAID use or alcohol consumption.  She does not take GLP-1 agonist.  She is status post kidney transplant and is monitored by [facility].  She has had COVID in the past as well as vaccines.  Denies any cough or congestion symptoms.  She has not had much to eat or drink as a result of her symptoms today.  She denies any recent travel, antibiotic use, medication changes, suspicious food intake, known sick contacts.    Past Medical History:  Diagnosis Date   Chronic kidney disease    Gastritis     Patient Active Problem List   Diagnosis Date Noted   Immunosuppression (HCC) 07/24/2016   SIRS (systemic inflammatory response syndrome) (HCC) 07/23/2016   Epigastric pain 07/31/2013   Abnormal ECG 07/31/2013   Abdominal pain 07/24/2013   Nausea & vomiting 07/24/2013   Gastroenteritis 07/24/2013   History of kidney transplant 07/24/2013   RENAL INSUFFICIENCY, CHRONIC 01/31/2007    Past Surgical History:  Procedure Laterality Date   CHOLECYSTECTOMY     KIDNEY TRANSPLANT  January2009   TUBAL LIGATION      OB History   No obstetric history on file.      Home Medications    Prior to Admission medications   Medication Sig Start Date End  Date Taking? Authorizing Provider  acetaminophen (TYLENOL) 500 MG tablet Take 500 mg by mouth every 6 (six) hours as needed for pain.   Yes [provider]  allopurinol (ZYLOPRIM) 100 MG tablet Take 1 tablet (100 mg total) by mouth daily. 02/09/23  Yes Nadara Mustard, MD  cycloSPORINE (SANDIMMUNE) 100 MG/ML solution Take 90 mg by mouth 2 (two) times daily.   Yes [provider]  famotidine (PEPCID) 40 MG tablet Take 40 mg by mouth daily. 07/10/23  Yes [provider]  magnesium oxide (MAG-OX) 400 MG tablet Take by mouth. 10/21/19  Yes [provider]  mycophenolate (CELLCEPT) 500 MG tablet Take 500 mg by mouth 3 (three) times daily.   Yes [provider]  predniSONE (DELTASONE) 5 MG tablet Take 5 mg by mouth daily.   Yes [provider]  sertraline (ZOLOFT) 50 MG tablet Take 50 mg by mouth daily. 07/10/23  Yes [provider]  simvastatin (ZOCOR) 20 MG tablet Take 20 mg by mouth at bedtime. 07/26/23  Yes [provider]  sucralfate (CARAFATE) 1 GM/10ML suspension Take 10 mLs (1 g total) by mouth 4 (four) times daily -  with meals and at bedtime. 07/27/23  Yes Jadiel Schmieder K, PA-C  amoxicillin-clavulanate (AUGMENTIN) 875-125 MG tablet Take 1 tablet by mouth every 12 (twelve) hours. 03/23/23   Renne Crigler, PA-C  ondansetron (ZOFRAN-ODT) 4 MG disintegrating  tablet Take 1 tablet (4 mg total) by mouth every 8 (eight) hours as needed for nausea or vomiting. 07/27/23  Yes Zailee Vallely, Noberto Retort, PA-C    Family History History reviewed. No pertinent family history.  Social History Social History   Tobacco Use   Smoking status: Never   Smokeless tobacco: Never  Vaping Use   Vaping status: Never Used  Substance Use Topics   Alcohol use: No   Drug use: Never     Allergies   Cephalosporins   Review of Systems Review of Systems  Constitutional:  Positive for activity change. Negative for appetite change, fatigue and fever.   Gastrointestinal:  Positive for abdominal pain and nausea. Negative for constipation, diarrhea and vomiting.  Genitourinary:  Negative for dysuria, frequency and urgency.     Physical Exam Triage Vital Signs ED Triage Vitals  Encounter Vitals Group     BP 07/27/23 1624 111/73     Systolic BP Percentile --      Diastolic BP Percentile --      Pulse Rate 07/27/23 1624 81     Resp 07/27/23 1624 18     Temp 07/27/23 1624 98.3 F (36.8 C)     Temp Source 07/27/23 1624 Oral     SpO2 07/27/23 1624 98 %     Weight --      Height --      Head Circumference --      Peak Flow --      Pain Score 07/27/23 1619 8     Pain Loc --      Pain Education --      Exclude from Growth Chart --    No data found.  Updated Vital Signs BP 111/73 (BP Location: Right Arm)   Pulse 81   Temp 98.3 F (36.8 C) (Oral)   Resp 18   LMP 07/23/2016   SpO2 98%   Visual Acuity Right Eye Distance:   Left Eye Distance:   Bilateral Distance:    Right Eye Near:   Left Eye Near:    Bilateral Near:     Physical Exam Vitals reviewed.  Constitutional:      General: She is awake. She is not in acute distress.    Appearance: Normal appearance. She is well-developed. She is not ill-appearing.     Comments: Very pleasant female appears stated age in no acute distress sitting comfortably in exam room  HENT:     Head: Normocephalic and atraumatic.  Cardiovascular:     Rate and Rhythm: Normal rate and regular rhythm.     Heart sounds: Normal heart sounds, S1 normal and S2 normal. No murmur heard. Pulmonary:     Effort: Pulmonary effort is normal.     Breath sounds: Normal breath sounds. No wheezing, rhonchi or rales.     Comments: Clear to auscultation bilaterally Abdominal:     General: Bowel sounds are normal.     Palpations: Abdomen is soft.     Tenderness: There is abdominal tenderness in the epigastric area. There is no right CVA tenderness, left CVA tenderness, guarding or rebound.  Psychiatric:         Behavior: Behavior is cooperative.      UC Treatments / Results  Labs (all labs ordered are listed, but only abnormal results are displayed) Labs Reviewed  CBC WITH DIFFERENTIAL/PLATELET - Abnormal; Notable for the following components:      Result Value   Lymphs Abs 0.6 (*)    Abs Immature  Granulocytes 0.08 (*)    All other components within normal limits  POCT URINALYSIS DIP (MANUAL ENTRY) - Abnormal; Notable for the following components:   Bilirubin, UA small (*)    Ketones, POC UA trace (5) (*)    All other components within normal limits  SARS CORONAVIRUS 2 (TAT 6-24 HRS)  COMPREHENSIVE METABOLIC PANEL  LIPASE, BLOOD    EKG   Radiology No results found.  Procedures Procedures (including critical care time)  Medications Ordered in UC Medications  alum & mag hydroxide-simeth (MAALOX/MYLANTA) 200-200-20 MG/5ML suspension 30 mL (30 mLs Oral Given 07/27/23 1650)  ondansetron (ZOFRAN-ODT) disintegrating tablet 4 mg (4 mg Oral Given 07/27/23 1651)  acetaminophen (TYLENOL) tablet 650 mg (650 mg Oral Given 07/27/23 1655)    Initial Impression / Assessment and Plan / UC Course  I have reviewed the triage vital signs and the nursing notes.  Pertinent labs & imaging results that were available during my care of the patient were reviewed by me and considered in my medical decision making (see chart for details).     Patient is well-appearing, afebrile, nontoxic.  She does have some mild tenderness in the epigastrium and we discussed potential utility of going to the emergency room for stat blood work and imaging.  Patient preferred to attempt workup here and so was given Zofran, Tylenol, Maalox with significant improvement of symptoms.  Her pain improved from a 10 to a 4 on a 0-10 pain scale.  Basic blood work including CBC, CMP, lipase was obtained and if any of this is abnormal she will need to go to the emergency room.  She has had gastritis with similar presentation in  the past and so is requesting a refill of Carafate.  This was sent to the pharmacy as well as prescription for Zofran.  She is to eat a bland diet and drink plenty of fluid.  Recommend she follow-up closely with her primary care.  If she has any worsening or changing symptoms she needs to be seen immediately.  Strict return precautions given.  All questions answered to patient and daughter satisfaction.  Final Clinical Impressions(s) / UC Diagnoses   Final diagnoses:  Acute gastritis, presence of bleeding unspecified, unspecified gastritis type  Abdominal pain, epigastric  Nausea without vomiting  History of kidney transplant     Discharge Instructions      I am glad that you are feeling better after the medication.  Start Carafate 3 times daily.  Take Zofran as needed for nausea symptoms.  Eat a bland diet and drink plenty of fluid.  I will contact you if the blood work is abnormal.  As we discussed, you should have a low threshold for going to the emergency room.  If you have recurrent pain, development of fever, nausea/vomiting, blood in your stool, blood in your vomit you need to go to the ER immediately.  Me alegro que te sientas mejor despus de la medicacin.  Inicie Carafate 3 veces al da.  Tome Zofran segn sea necesario para los sntomas de nuseas.  Consuma una dieta blanda y beba mucho lquido.  Me comunicar con usted si el anlisis de sangre es anormal.  [place] comentamos, debe tener un umbral bajo para ir a la sala de Sports administrator.  Si tiene [employer], fiebre, nuseas/vmitos, [employer], sangre en el vmito, debe acudir a la sala de emergencias de inmediato.     ED Prescriptions     Medication Sig Dispense Auth. Provider  sucralfate (CARAFATE) 1 g tablet  (Status: Discontinued) Take 1 tablet (1 g total) by mouth 3 (three) times daily before meals. 21 tablet Ruvim Risko K, PA-C   ondansetron (ZOFRAN-ODT) 4 MG disintegrating tablet Take 1 tablet (4 mg total) by  mouth every 8 (eight) hours as needed for nausea or vomiting. 9 tablet Khayla Koppenhaver K, PA-C   sucralfate (CARAFATE) 1 GM/10ML suspension Take 10 mLs (1 g total) by mouth 4 (four) times daily -  with meals and at bedtime. 420 mL Whittney Steenson K, PA-C      PDMP not reviewed this encounter.   Jeani Hawking, PA-C 07/27/23 1749

## 2023-07-27 NOTE — Discharge Instructions (Signed)
I am glad that you are feeling better after the medication.  Start Carafate 3 times daily.  Take Zofran as needed for nausea symptoms.  Eat a bland diet and drink plenty of fluid.  I will contact you if the blood work is abnormal.  As we discussed, you should have a low threshold for going to the emergency room.  If you have recurrent pain, development of fever, nausea/vomiting, blood in your stool, blood in your vomit you need to go to the ER immediately.  Me alegro que te sientas mejor despus de la medicacin.  Inicie Carafate 3 veces al da.  Tome Zofran segn sea necesario para los sntomas de nuseas.  Consuma una dieta blanda y beba mucho lquido.  Me comunicar con usted si el anlisis de sangre es anormal.  [place] comentamos, debe tener un umbral bajo para ir a la sala de Sports administrator.  Si tiene [employer], fiebre, nuseas/vmitos, [employer], sangre en el vmito, debe acudir a la sala de emergencias de inmediato.

## 2023-07-27 NOTE — ED Triage Notes (Signed)
At this time pt only c/o is abnormal liver enzymes. And pain and body aches have resolved

## 2023-07-28 DIAGNOSIS — D849 Immunodeficiency, unspecified: Secondary | ICD-10-CM

## 2023-07-28 DIAGNOSIS — R1013 Epigastric pain: Secondary | ICD-10-CM | POA: Diagnosis present

## 2023-07-28 DIAGNOSIS — E7849 Other hyperlipidemia: Secondary | ICD-10-CM | POA: Diagnosis not present

## 2023-07-28 DIAGNOSIS — R7401 Elevation of levels of liver transaminase levels: Secondary | ICD-10-CM | POA: Diagnosis present

## 2023-07-28 DIAGNOSIS — N183 Chronic kidney disease, stage 3 unspecified: Secondary | ICD-10-CM | POA: Diagnosis not present

## 2023-07-28 DIAGNOSIS — Z940 Kidney transplant status: Secondary | ICD-10-CM | POA: Diagnosis not present

## 2023-07-28 DIAGNOSIS — Z1152 Encounter for screening for COVID-19: Secondary | ICD-10-CM | POA: Diagnosis not present

## 2023-07-28 DIAGNOSIS — E785 Hyperlipidemia, unspecified: Secondary | ICD-10-CM

## 2023-07-28 DIAGNOSIS — Z79899 Other long term (current) drug therapy: Secondary | ICD-10-CM | POA: Diagnosis not present

## 2023-07-28 LAB — HEPATITIS PANEL, ACUTE
HCV Ab: NONREACTIVE
HCV Ab: NONREACTIVE
Hep A IgM: NONREACTIVE
Hep A IgM: NONREACTIVE
Hep B C IgM: NONREACTIVE
Hep B C IgM: NONREACTIVE
Hepatitis B Surface Ag: NONREACTIVE
Hepatitis B Surface Ag: NONREACTIVE

## 2023-07-28 LAB — CBC WITH DIFFERENTIAL/PLATELET
Abs Immature Granulocytes: 0.06 10*3/uL (ref 0.00–0.07)
Basophils Absolute: 0 10*3/uL (ref 0.0–0.1)
Basophils Relative: 0 %
Eosinophils Absolute: 0 10*3/uL (ref 0.0–0.5)
Eosinophils Relative: 0 %
HCT: 38.9 % (ref 36.0–46.0)
Hemoglobin: 12.5 g/dL (ref 12.0–15.0)
Immature Granulocytes: 1 %
Lymphocytes Relative: 9 %
Lymphs Abs: 0.8 10*3/uL (ref 0.7–4.0)
MCH: 28.9 pg (ref 26.0–34.0)
MCHC: 32.1 g/dL (ref 30.0–36.0)
MCV: 89.8 fL (ref 80.0–100.0)
Monocytes Absolute: 0.4 10*3/uL (ref 0.1–1.0)
Monocytes Relative: 4 %
Neutro Abs: 8.1 10*3/uL — ABNORMAL HIGH (ref 1.7–7.7)
Neutrophils Relative %: 86 %
Platelets: 214 10*3/uL (ref 150–400)
RBC: 4.33 MIL/uL (ref 3.87–5.11)
RDW: 14.4 % (ref 11.5–15.5)
WBC: 9.4 10*3/uL (ref 4.0–10.5)
nRBC: 0 % (ref 0.0–0.2)

## 2023-07-28 LAB — COMPREHENSIVE METABOLIC PANEL
ALT: 312 U/L — ABNORMAL HIGH (ref 0–44)
AST: 309 U/L — ABNORMAL HIGH (ref 15–41)
Albumin: 3.7 g/dL (ref 3.5–5.0)
Alkaline Phosphatase: 132 U/L — ABNORMAL HIGH (ref 38–126)
Anion gap: 10 (ref 5–15)
BUN: 21 mg/dL — ABNORMAL HIGH (ref 6–20)
CO2: 17 mmol/L — ABNORMAL LOW (ref 22–32)
Calcium: 8.8 mg/dL — ABNORMAL LOW (ref 8.9–10.3)
Chloride: 107 mmol/L (ref 98–111)
Creatinine, Ser: 1.1 mg/dL — ABNORMAL HIGH (ref 0.44–1.00)
GFR, Estimated: >60 mL/min (ref >60)
Glucose, Bld: 110 mg/dL — ABNORMAL HIGH (ref 70–99)
Potassium: 4.1 mmol/L (ref 3.5–5.1)
Sodium: 134 mmol/L — ABNORMAL LOW (ref 135–145)
Total Bilirubin: 2.3 mg/dL — ABNORMAL HIGH (ref 0.3–1.2)
Total Protein: 6.8 g/dL (ref 6.5–8.1)

## 2023-07-28 LAB — AMMONIA: Ammonia: 18 umol/L (ref 9–35)

## 2023-07-28 LAB — PROTIME-INR
INR: 1 (ref 0.8–1.2)
Prothrombin Time: 13.7 seconds (ref 11.4–15.2)

## 2023-07-28 LAB — SARS CORONAVIRUS 2 (TAT 6-24 HRS): SARS Coronavirus 2: NEGATIVE

## 2023-07-28 MED ORDER — PREDNISONE 5 MG PO TABS
5.0000 mg | ORAL_TABLET | Freq: Every day | ORAL | Status: DC
  Administered 2023-07-28 – 2023-07-29 (×2): 5 mg via ORAL
  Filled 2023-07-28 (×2): qty 1

## 2023-07-28 MED ORDER — ONDANSETRON HCL 4 MG/2ML IJ SOLN: 4.0000 mg | Freq: Four times a day (QID) | INTRAMUSCULAR | Status: DC | PRN

## 2023-07-28 MED ORDER — CYCLOSPORINE MODIFIED (NEORAL) 100 MG/ML PO SOLN
90.0000 mg | Freq: Two times a day (BID) | ORAL | Status: DC
  Administered 2023-07-28 – 2023-07-29 (×2): 90 mg via ORAL
  Filled 2023-07-28 (×2): qty 0.9

## 2023-07-28 MED ORDER — PREDNISONE 5 MG PO TABS: 5.0000 mg | ORAL_TABLET | Freq: Every day | ORAL | Status: DC

## 2023-07-28 MED ORDER — CYCLOSPORINE 100 MG/ML PO SOLN: 90.0000 mg | Freq: Two times a day (BID) | ORAL | Status: DC

## 2023-07-28 MED ORDER — CYCLOSPORINE 100 MG/ML PO SOLN
90.0000 mg | Freq: Two times a day (BID) | ORAL | Status: DC
  Administered 2023-07-28: 90 mg via ORAL
  Filled 2023-07-28 (×2): qty 0.9

## 2023-07-28 MED ORDER — MYCOPHENOLATE MOFETIL 500 MG PO TABS: 500.0000 mg | ORAL_TABLET | Freq: Three times a day (TID) | ORAL | Status: DC

## 2023-07-28 MED ORDER — HEPARIN SODIUM (PORCINE) 5000 UNIT/ML IJ SOLN
5000.0000 [IU] | Freq: Three times a day (TID) | INTRAMUSCULAR | Status: DC
  Administered 2023-07-28 (×2): 5000 [IU] via SUBCUTANEOUS
  Filled 2023-07-28 (×2): qty 1

## 2023-07-28 MED ORDER — MYCOPHENOLATE MOFETIL 250 MG PO CAPS
500.0000 mg | ORAL_CAPSULE | Freq: Three times a day (TID) | ORAL | Status: DC
  Administered 2023-07-28 – 2023-07-29 (×5): 500 mg via ORAL
  Filled 2023-07-28 (×6): qty 2

## 2023-07-28 MED ORDER — FAMOTIDINE 20 MG PO TABS
40.0000 mg | ORAL_TABLET | Freq: Every day | ORAL | Status: DC
  Administered 2023-07-28 – 2023-07-29 (×2): 40 mg via ORAL
  Filled 2023-07-28 (×2): qty 2

## 2023-07-28 MED ORDER — SERTRALINE HCL 50 MG PO TABS
50.0000 mg | ORAL_TABLET | Freq: Every day | ORAL | Status: DC
  Administered 2023-07-28 – 2023-07-29 (×2): 50 mg via ORAL
  Filled 2023-07-28 (×2): qty 1

## 2023-07-28 MED ORDER — SUCRALFATE 1 GM/10ML PO SUSP
1.0000 g | Freq: Three times a day (TID) | ORAL | Status: DC
  Administered 2023-07-28 – 2023-07-29 (×5): 1 g via ORAL
  Filled 2023-07-28 (×5): qty 10

## 2023-07-28 MED ORDER — SIMVASTATIN 20 MG PO TABS: 20.0000 mg | ORAL_TABLET | Freq: Every day | ORAL | Status: DC

## 2023-07-28 NOTE — Progress Notes (Addendum)
Plan of Care Note for accepted transfer   Patient: Jermani Golub MRN: [id number]   DOA: 07/27/2023  Facility requesting transfer: [facility]   Requesting Provider: Dr. Wilkie Aye   Reason for transfer: Elevated transaminases   Facility course: 47 yr old female with hx of renal transplant and GERD who presented to urgent care with abdominal pain and nausea, was treated with Zofran, and feels better now but was noted to have elevated LFTs and directed to ED.   AST is 593 and ALT 391 with normal alk phos, t bili 1.3, and no acute findings on CT.   GI (Dr. Bosie Clos) was consulted by ED physician and recommended observing in hospital and trending LFTs. He later noted that this is a patient of Dr. Loreta Ave, and if formal GI consultation is needed, whoever is covering for her should be contacted.   Plan of care: The patient is accepted for admission to Med-surg  unit, at [facility].   Author: Briscoe Deutscher, MD 07/28/2023  Check [email or web address] for on-call coverage.  Nursing staff, Please call [facility] Admits & Consults System-Wide number on Amion as soon as patient's arrival, so appropriate admitting provider can evaluate the pt.

## 2023-07-28 NOTE — Progress Notes (Addendum)
Pt arrived to floor from [facility] ED. Pt alert and oriented x4. Ambulatory. Skin intact. Denies c/o pain. Pt oriented to room and call bell. [facility] admission.

## 2023-07-28 NOTE — Care Plan (Signed)
This 47 years old female with PMH significant for renal transplant, on immunosuppressive therapy, CKD stage IIIb, hyperlipidemia, depression, GERD presented in the ED with multiple complaints.  Patient has GERD from her prednisone therapy.  Patient has developed headache,  body ache,  chills,  epigastric pain radiating towards the back.  Patient denies any recent travel or sick contacts.  She went to the ED and found to have elevated liver enzymes.  CT abdomen and pelvis no acute abnormality found.  Patient was admitted for further evaluation.  Patient was seen and examined at bedside.  All the lab work discussed with patient and daughter.  Liver enzymes are trending down.  Statin kept on hold.  Continued on prednisone, CellCept  and cyclosporine.

## 2023-07-28 NOTE — H&P (Signed)
PCP:   Lewis Moccasin, MD   Chief Complaint:  Epigastric pain  HPI: This is a 47 year old female with past medical history of renal transplant, immunosuppressive, CKD stage III, HLD, depression, GERD.  Yesterday patient developed headache, chills and bodyaches.  She developed some pain that went from her epigastric area through to her back.  She has had this before with her stomach issues.  She has some GERD from her prednisone.  The patient endorses nausea but no vomiting or diarrhea.  She denies new medications or over-the-counter medications.  She denies taking NSAIDs or Tylenol for pain.  She denies any new or change in her medication.  Simvastatin is on any medication.  She denies any ill contacts or recent travel.  The only change in her medications magnesium that was recently increased to twice daily.  Her epigastric pain was severe, she went to [facility] urgent care.  At [facility]  she was given medications and felt improved however labs done revealed elevated LFTs.  AST 850, alk phos 413, T. bili 1.2, COVID pending.  CT abdomen pelvis no acute abnormality.  Gastroenterologist on-call Dr. Bosie Clos contacted.  Overnight observation recommended with formal consult in a.m. if needed.  Review of Systems:  Per HPI  Past Medical History: Past Medical History:  Diagnosis Date   Chronic kidney disease    Gastritis    Past Surgical History:  Procedure Laterality Date   CHOLECYSTECTOMY     KIDNEY TRANSPLANT  January2009   TUBAL LIGATION      Medications: Prior to Admission medications   Medication Sig Start Date End Date Taking? Authorizing Provider  acetaminophen (TYLENOL) 500 MG tablet Take 500 mg by mouth every 6 (six) hours as needed for pain.    [provider]  allopurinol (ZYLOPRIM) 100 MG tablet Take 1 tablet (100 mg total) by mouth daily. 02/09/23   Nadara Mustard, MD  amoxicillin-clavulanate (AUGMENTIN) 875-125 MG tablet Take 1 tablet by mouth every 12 (twelve)  hours. 03/23/23   Renne Crigler, PA-C  cycloSPORINE (SANDIMMUNE) 100 MG/ML solution Take 90 mg by mouth 2 (two) times daily.    [provider]  famotidine (PEPCID) 40 MG tablet Take 40 mg by mouth daily. 07/10/23   [provider]  magnesium oxide (MAG-OX) 400 MG tablet Take by mouth. 10/21/19   [provider]  mycophenolate (CELLCEPT) 500 MG tablet Take 500 mg by mouth 3 (three) times daily.    [provider]  ondansetron (ZOFRAN-ODT) 4 MG disintegrating tablet Take 1 tablet (4 mg total) by mouth every 8 (eight) hours as needed for nausea or vomiting. 07/27/23   Raspet, Denny Peon K, PA-C  predniSONE (DELTASONE) 5 MG tablet Take 5 mg by mouth daily.    [provider]  sertraline (ZOLOFT) 50 MG tablet Take 50 mg by mouth daily. 07/10/23   [provider]  simvastatin (ZOCOR) 20 MG tablet Take 20 mg by mouth at bedtime. 07/26/23   [provider]  sucralfate (CARAFATE) 1 GM/10ML suspension Take 10 mLs (1 g total) by mouth 4 (four) times daily -  with meals and at bedtime. 07/27/23   Raspet, Noberto Retort, PA-C    Allergies:   Allergies  Allergen Reactions   Cephalosporins Anaphylaxis    Social History:  reports that she has never smoked. She has never used smokeless tobacco. She reports that she does not drink alcohol and does not use drugs.  Family History: History reviewed. No pertinent family history.  Physical Exam: Vitals:   07/27/23 2039 07/27/23 2300 07/28/23 0023 07/28/23 0127  BP: 111/62 132/73  135/74  Pulse: 70 65  60  Resp: 20 16  19   Temp: 98.9 F (37.2 C)  99.2 F (37.3 C) 98.6 F (37 C)  TempSrc: Oral  Oral Oral  SpO2: 98% 100%  98%  Weight:      Height:        General:  Alert and oriented times three, well developed and nourished, no acute distress Eyes: PERRLA, pink conjunctiva, no scleral icterus ENT: Moist oral mucosa, neck supple, no thyromegaly Lungs: clear to ascultation, no wheeze, no crackles, no use of  accessory muscles Cardiovascular: regular rate and rhythm, no regurgitation, no gallops, no murmurs. No carotid bruits, no JVD Abdomen: soft, positive BS, non-tender, non-distended, no organomegaly, not an acute abdomen GU: not examined Neuro: CN II - XII grossly intact, sensation intact Musculoskeletal: strength 5/5 all extremities, no clubbing, cyanosis or edema Skin: no rash, no subcutaneous crepitation, no decubitus Psych: appropriate patient   Labs on Admission:  Recent Labs    07/27/23 1644 07/27/23 2203  NA 139 136  K 3.5 3.9  CL 104 103  CO2 20* 24  GLUCOSE 123* 117*  BUN 29* 32*  CREATININE 1.49* 1.43*  CALCIUM 9.0 9.0   Recent Labs    07/27/23 1644 07/27/23 2203  AST 850* 593*  ALT 413* 391*  ALKPHOS 132* 126  BILITOT 1.2 1.3*  PROT 6.6 6.2*  ALBUMIN 3.7 3.9   Recent Labs    07/27/23 1644  LIPASE 27   Recent Labs    07/27/23 1644  WBC 8.8  NEUTROABS 7.6  HGB 12.0  HCT 37.9  MCV 85.6  PLT 227    Radiological Exams on Admission: CT ABDOMEN PELVIS WO CONTRAST  Result Date: 07/27/2023 CLINICAL DATA:  Abdominal pain and elevated liver enzymes, initial encounter EXAM: CT ABDOMEN AND PELVIS WITHOUT CONTRAST TECHNIQUE: Multidetector CT imaging of the abdomen and pelvis was performed following the standard protocol without IV contrast. RADIATION DOSE REDUCTION: This exam was performed according to the departmental dose-optimization program which includes automated exposure control, adjustment of the mA and/or kV according to patient size and/or use of iterative reconstruction technique. COMPARISON:  None Available. FINDINGS: Lower chest: No acute abnormality. Hepatobiliary: No focal liver abnormality is seen. Status post cholecystectomy. No biliary dilatation. Pancreas: Unremarkable. No pancreatic ductal dilatation or surrounding inflammatory changes. Spleen: Normal in size without focal abnormality. Adrenals/Urinary Tract: Adrenal glands are within normal  limits. Native kidneys are shrunken consistent with the end-stage renal disease. Renal transplant is noted in the right iliac fossa. No obstructive changes are seen. Bladder is well distended. Stomach/Bowel: No obstructive or inflammatory changes of the colon are noted. The appendix is within normal limits. Small bowel and stomach are unremarkable. Vascular/Lymphatic: Aortic atherosclerosis. No enlarged abdominal or pelvic lymph nodes. Reproductive: Uterus and bilateral adnexa are unremarkable. Other: No abdominal wall hernia or abnormality. No abdominopelvic ascites. Musculoskeletal: No acute or significant osseous findings. IMPRESSION: Chronic changes of end-stage renal disease with right renal transplant. No acute abnormality noted. Electronically Signed   By: Alcide Clever M.D.   On: 07/27/2023 22:14    Assessment/Plan Present on Admission:  Elevated transaminase level -Monitor, CMP, ammonia, INR in AM.  INR -Avoid hepatotoxic medications -Hepatitis panel ordered -Simvastatin on hold -GI consult if needed.  Team   HLD -Hold statin   History of renal transplant //  CKD stage III  Immunosuppression (HCC) -  Continue prednisone, CellCept, cyclosporine resumed -Renal function stable at baseline   GERD -On Carafate, Pepcid -Patient had been on PPIs, nephrologist discontinued.   Depression -Continue Zoloft   Vasiliy Mccarry 07/28/2023, 2:57 AM

## 2023-07-28 NOTE — Progress Notes (Signed)
   07/28/23 2158  Provider Notification  Provider Name/Title A. Chavez,NP  Date Provider Notified 07/28/23  Time Provider Notified 2100  Method of Notification Page  Notification Reason Red med refusal  Provider response No new orders  Date of Provider Response 07/28/23  Time of Provider Response 2101   Pt refused heparin shot, pt stated it makes her itch all over. A. Chavez,Np made aware via secure chat.

## 2023-07-29 DIAGNOSIS — R7401 Elevation of levels of liver transaminase levels: Secondary | ICD-10-CM | POA: Diagnosis not present

## 2023-07-29 LAB — COMPREHENSIVE METABOLIC PANEL
ALT: 221 U/L — ABNORMAL HIGH (ref 0–44)
AST: 152 U/L — ABNORMAL HIGH (ref 15–41)
Albumin: 3.5 g/dL (ref 3.5–5.0)
Alkaline Phosphatase: 116 U/L (ref 38–126)
Anion gap: 9 (ref 5–15)
BUN: 19 mg/dL (ref 6–20)
CO2: 19 mmol/L — ABNORMAL LOW (ref 22–32)
Calcium: 8.7 mg/dL — ABNORMAL LOW (ref 8.9–10.3)
Chloride: 106 mmol/L (ref 98–111)
Creatinine, Ser: 1.29 mg/dL — ABNORMAL HIGH (ref 0.44–1.00)
GFR, Estimated: 52 mL/min — ABNORMAL LOW (ref >60)
Glucose, Bld: 124 mg/dL — ABNORMAL HIGH (ref 70–99)
Potassium: 4.2 mmol/L (ref 3.5–5.1)
Sodium: 134 mmol/L — ABNORMAL LOW (ref 135–145)
Total Bilirubin: 1 mg/dL (ref 0.3–1.2)
Total Protein: 6.7 g/dL (ref 6.5–8.1)

## 2023-07-29 LAB — CK: Total CK: 45 U/L (ref 38–234)

## 2023-07-29 NOTE — Discharge Summary (Signed)
Sarah Dyer KCM:[id number] DOB: [birth year] DOA: 07/27/2023  PCP: Lewis Moccasin, MD  Admit date: 07/27/2023  Discharge date: 07/29/2023  Admitted From: Home   disposition: Home   Recommendations for Outpatient Follow-up:   Follow up with PCP in 2-3 days  PCP Please obtain liver function test at next visit.  (see Discharge instructions)    Home Health: N/A Equipment/Devices: N/A Consultations: None Discharge Condition: Stable CODE STATUS: Full Diet Recommendation: Heart Healthy   Diet Order             Diet - low sodium heart healthy           Diet Heart Room service appropriate? Yes; Fluid consistency: Thin  Diet effective now                    Chief Complaint  Patient presents with   Abnormal Lab    Liver enzymes     Brief history of present illness from the day of admission and additional interim summary     This is a 47 year old female with past medical history of renal transplant, immunosuppressive, CKD stage III, HLD, depression, GERD.  Yesterday patient developed headache, chills and bodyaches.  She developed some pain that went from her epigastric area through to her back.  She has had this before with her stomach issues.  She has some GERD from her prednisone.  The patient endorses nausea but no vomiting or diarrhea.  She denies new medications or over-the-counter medications.  She denies taking NSAIDs or Tylenol for pain.  She denies any new or change in her medication.  Simvastatin is on any medication.  She denies any ill contacts or recent travel.  The only change in her medications magnesium that was recently increased to twice daily.  Her epigastric pain was severe, she went to [facility] urgent care.   At [facility]  she was given medications and felt improved  however labs done revealed elevated LFTs.  AST 850, alk phos 413, T. bili 1.2, COVID pending.  CT abdomen pelvis no acute abnormality.  Gastroenterologist on-call Dr. Bosie Clos contacted.  Overnight observation recommended with formal consult in a.m. if needed.                                                                 Hospital Course    Immunocompromise patient on renal transplant immunosuppressants was admitted for elevated transaminases and abdominal pain.  Abdominal pelvis CT was negative for liver abnormalities.  Patient notes that she was started on simvastatin less than 6 months ago.  Simvastatin was held.  LFTs have improved significantly overnight.  Abdominal pain has resolved.  Kidney function has stayed stable at her baseline.  Patient is to be discharged home today off of simvastatin to follow-up with  her PCP in 2 to 3 days for repeat LFTs and to discuss if she needs further treatment given what appears to be intolerance to statins.   Discharge diagnosis     Principal Problem:   Elevated transaminase level Active Problems:   Renal transplant, status post   Immunosuppression (HCC)   HLD (hyperlipidemia)    Discharge instructions    Discharge Instructions     Diet - low sodium heart healthy   Complete by: As directed    Discharge instructions   Complete by: As directed    Stop taking Simvastatin.  Make an appointment to see your PCP in 2-3 days to 1) blood draw to followup your liver function tests to make sure they are returning to normal and 2) to let them know you were taken off the simvastatin.       Discharge Medications   Allergies as of 07/29/2023       Reactions   Cephalosporins Anaphylaxis        Medication List     STOP taking these medications    simvastatin 20 MG tablet Commonly known as: ZOCOR       TAKE these medications    acetaminophen 500 MG tablet Commonly known as: TYLENOL Take 500 mg by mouth every 6 (six) hours as needed for  pain.   cycloSPORINE 100 MG/ML solution Commonly known as: SANDIMMUNE Take 90 mg by mouth 2 (two) times daily.   famotidine 40 MG tablet Commonly known as: PEPCID Take 40 mg by mouth daily.   magnesium oxide 400 MG tablet Commonly known as: MAG-OX Take 400 mg by mouth daily.   mycophenolate 500 MG tablet Commonly known as: CELLCEPT Take 500 mg by mouth 3 (three) times daily.   ondansetron 4 MG disintegrating tablet Commonly known as: ZOFRAN-ODT Take 1 tablet (4 mg total) by mouth every 8 (eight) hours as needed for nausea or vomiting.   predniSONE 5 MG tablet Commonly known as: DELTASONE Take 5 mg by mouth daily.   sertraline 50 MG tablet Commonly known as: ZOLOFT Take 50 mg by mouth daily.   sucralfate 1 GM/10ML suspension Commonly known as: Carafate Take 10 mLs (1 g total) by mouth 4 (four) times daily -  with meals and at bedtime.          Major procedures and Radiology Reports - PLEASE review detailed and final reports thoroughly  -       CT ABDOMEN PELVIS WO CONTRAST  Result Date: 07/27/2023 CLINICAL DATA:  Abdominal pain and elevated liver enzymes, initial encounter EXAM: CT ABDOMEN AND PELVIS WITHOUT CONTRAST TECHNIQUE: Multidetector CT imaging of the abdomen and pelvis was performed following the standard protocol without IV contrast. RADIATION DOSE REDUCTION: This exam was performed according to the departmental dose-optimization program which includes automated exposure control, adjustment of the mA and/or kV according to patient size and/or use of iterative reconstruction technique. COMPARISON:  None Available. FINDINGS: Lower chest: No acute abnormality. Hepatobiliary: No focal liver abnormality is seen. Status post cholecystectomy. No biliary dilatation. Pancreas: Unremarkable. No pancreatic ductal dilatation or surrounding inflammatory changes. Spleen: Normal in size without focal abnormality. Adrenals/Urinary Tract: Adrenal glands are within normal limits.  Native kidneys are shrunken consistent with the end-stage renal disease. Renal transplant is noted in the right iliac fossa. No obstructive changes are seen. Bladder is well distended. Stomach/Bowel: No obstructive or inflammatory changes of the colon are noted. The appendix is within normal limits. Small bowel and stomach are unremarkable. Vascular/Lymphatic: Aortic  atherosclerosis. No enlarged abdominal or pelvic lymph nodes. Reproductive: Uterus and bilateral adnexa are unremarkable. Other: No abdominal wall hernia or abnormality. No abdominopelvic ascites. Musculoskeletal: No acute or significant osseous findings. IMPRESSION: Chronic changes of end-stage renal disease with right renal transplant. No acute abnormality noted. Electronically Signed   By: Alcide Clever M.D.   On: 07/27/2023 22:14    Micro Results    Recent Results (from the past 240 hour(s))  SARS CORONAVIRUS 2 (TAT 6-24 HRS) Anterior Nasal Swab     Status: None   Collection Time: 07/27/23  4:53 PM   Specimen: Anterior Nasal Swab  Result Value Ref Range Status   SARS Coronavirus 2 NEGATIVE NEGATIVE Final    Comment: (NOTE) SARS-CoV-2 target nucleic acids are NOT DETECTED.  The SARS-CoV-2 RNA is generally detectable in upper and lower respiratory specimens during the acute phase of infection. Negative results do not preclude SARS-CoV-2 infection, do not rule out co-infections with other pathogens, and should not be used as the sole basis for treatment or other patient management decisions. Negative results must be combined with clinical observations, patient history, and epidemiological information. The expected result is Negative.  Fact Sheet for Patients: HairSlick.no  Fact Sheet for Healthcare Providers: quierodirigir.com  This test is not yet approved or cleared by the [place] FDA and  has been authorized for detection and/or diagnosis of SARS-CoV-2 by FDA  under an Emergency Use Authorization (EUA). This EUA will remain  in effect (meaning this test can be used) for the duration of the COVID-19 declaration under Se ction 564(b)(1) of the Act, 21 U.S.C. section 360bbb-3(b)(1), unless the authorization is terminated or revoked sooner.  Performed at [facility], [address], [place], [place] [address]     Today   Subjective    Jaxie Sokolowski feels much improved since admission.  Feels ready to go home.  Denies chest pain, shortness of breath or abdominal pain.  Feels they can take care of themselves with the resources they have at home.  Objective   Blood pressure 117/62, pulse 61, temperature 98.2 F (36.8 C), temperature source Oral, resp. rate 20, height 5' (1.524 m), weight 59.4 kg, last menstrual period 07/23/2016, SpO2 98%.   Intake/Output Summary (Last 24 hours) at 07/29/2023 1351 Last data filed at 07/29/2023 0859 Gross per 24 hour  Intake 720 ml  Output --  Net 720 ml    Exam General: Patient appears well and in good spirits sitting up in bed in no acute distress.  Eyes: sclera anicteric, conjuctiva mild injection bilaterally CVS: S1-S2, regular  Respiratory:  decreased air entry bilaterally secondary to decreased inspiratory effort, rales at bases  GI: NABS, soft, NT  LE: No edema.  Neuro: A/O x 3, Moving all extremities equally with normal strength, CN 3-12 intact, grossly nonfocal.  Psych: patient is logical and coherent, judgement and insight appear normal, mood and affect appropriate to situation.    Data Review   CBC w Diff:  Lab Results  Component Value Date   WBC 9.4 07/28/2023   HGB 12.5 07/28/2023   HCT 38.9 07/28/2023   PLT 214 07/28/2023   LYMPHOPCT 9 07/28/2023   MONOPCT 4 07/28/2023   EOSPCT 0 07/28/2023   BASOPCT 0 07/28/2023    CMP:  Lab Results  Component Value Date   NA 134 (L) 07/29/2023   K 4.2 07/29/2023   CL 106 07/29/2023   CO2 19 (L) 07/29/2023   BUN 19  07/29/2023  CREATININE 1.29 (H) 07/29/2023   PROT 6.7 07/29/2023   ALBUMIN 3.5 07/29/2023   BILITOT 1.0 07/29/2023   ALKPHOS 116 07/29/2023   AST 152 (H) 07/29/2023   ALT 221 (H) 07/29/2023  .   Total Time in preparing paper work, data evaluation and todays exam - 35 minutes  Pieter Partridge M.D on 07/29/2023 at 1:51 PM  [facility]

## 2023-07-29 NOTE — Progress Notes (Signed)
Reviewed d/c instructions with pt and daughter. All questions answered. Pt taken by wheelchair to meet ride.

## 2023-08-01 ENCOUNTER — Ambulatory Visit
Admission: RE | Admit: 2023-08-01 | Discharge: 2023-08-01 | Disposition: A | Discharge status: Home / Self Care | Payer: Commercial Managed Care - HMO | Source: Ambulatory Visit | Attending: Internal Medicine | Admitting: Internal Medicine

## 2023-08-01 DIAGNOSIS — M816 Localized osteoporosis [Lequesne]: Secondary | ICD-10-CM

## 2023-08-24 ENCOUNTER — Ambulatory Visit: Payer: Self-pay

## 2023-08-24 ENCOUNTER — Ambulatory Visit
Admission: RE | Admit: 2023-08-24 | Discharge: 2023-08-24 | Disposition: A | Discharge status: Home / Self Care | Payer: Commercial Managed Care - HMO | Source: Ambulatory Visit | Attending: Family Medicine | Admitting: Family Medicine

## 2023-08-24 VITALS — BP 151/80 | HR 59 | Temp 98.5°F | Resp 16

## 2023-08-24 DIAGNOSIS — H6501 Acute serous otitis media, right ear: Secondary | ICD-10-CM

## 2023-08-24 NOTE — Discharge Instructions (Addendum)
Follow up with PCP if no improvement in 1 week  Take flonase and sudafed as discussed

## 2023-08-24 NOTE — ED Provider Notes (Signed)
[facility]    CSN: [id number] Arrival date & time: 08/24/23  1321      History   Chief Complaint Chief Complaint  Patient presents with   Ear Fullness    Entered by patient   Appointment    1400    HPI Sarah Dyer is a 47 y.o. female.   Patient here with R ear pain x 2 weeks.  She notes fullness in ear, hearing loss.  Started after she went on airplane while sick with URI.  URI sx resolved, ear fullness persists.  No advil or tylenol today.      Past Medical History:  Diagnosis Date   Chronic kidney disease    Gastritis     Patient Active Problem List   Diagnosis Date Noted   Elevated transaminase level 07/28/2023   HLD (hyperlipidemia) 07/28/2023   Immunosuppression (HCC) 07/24/2016   SIRS (systemic inflammatory response syndrome) (HCC) 07/23/2016   Epigastric pain 07/31/2013   Abnormal ECG 07/31/2013   Abdominal pain 07/24/2013   Nausea & vomiting 07/24/2013   Gastroenteritis 07/24/2013   Renal transplant, status post 07/24/2013   RENAL INSUFFICIENCY, CHRONIC 01/31/2007    Past Surgical History:  Procedure Laterality Date   CHOLECYSTECTOMY     KIDNEY TRANSPLANT  January2009   TUBAL LIGATION      OB History   No obstetric history on file.      Home Medications    Prior to Admission medications   Medication Sig Start Date End Date Taking? Authorizing Provider  acetaminophen (TYLENOL) 500 MG tablet Take 500 mg by mouth every 6 (six) hours as needed for pain.    [provider]  cycloSPORINE (SANDIMMUNE) 100 MG/ML solution Take 90 mg by mouth 2 (two) times daily.    [provider]  famotidine (PEPCID) 40 MG tablet Take 40 mg by mouth daily. 07/10/23   [provider]  magnesium oxide (MAG-OX) 400 MG tablet Take 400 mg by mouth daily. 10/21/19   [provider]  mycophenolate (CELLCEPT) 500 MG tablet Take 500 mg by mouth 3 (three) times daily.    [provider]  ondansetron  (ZOFRAN-ODT) 4 MG disintegrating tablet Take 1 tablet (4 mg total) by mouth every 8 (eight) hours as needed for nausea or vomiting. Patient not taking: Reported on 07/28/2023 07/27/23   Raspet, Denny Peon K, PA-C  predniSONE (DELTASONE) 5 MG tablet Take 5 mg by mouth daily.    [provider]  sertraline (ZOLOFT) 50 MG tablet Take 50 mg by mouth daily. 07/10/23   [provider]  sucralfate (CARAFATE) 1 GM/10ML suspension Take 10 mLs (1 g total) by mouth 4 (four) times daily -  with meals and at bedtime. Patient not taking: Reported on 07/28/2023 07/27/23   Raspet, Noberto Retort, PA-C    Family History History reviewed. No pertinent family history.  Social History Social History   Tobacco Use   Smoking status: Never   Smokeless tobacco: Never  Vaping Use   Vaping status: Never Used  Substance Use Topics   Alcohol use: No   Drug use: Never     Allergies   Cephalosporins and Heparin   Review of Systems Review of Systems  Constitutional:  Negative for chills, fatigue and fever.  HENT:  Positive for hearing loss. Negative for congestion, ear discharge, ear pain, facial swelling, nosebleeds, postnasal drip, rhinorrhea, sinus pressure, sinus pain and sore throat.   Eyes:  Negative for pain and redness.  Respiratory:  Negative  for cough, shortness of breath and wheezing.   Gastrointestinal:  Negative for abdominal pain, diarrhea, nausea and vomiting.  Musculoskeletal:  Negative for arthralgias and myalgias.  Skin:  Negative for rash.  Neurological:  Negative for light-headedness and headaches.  Hematological:  Negative for adenopathy. Does not bruise/bleed easily.  Psychiatric/Behavioral:  Negative for confusion and sleep disturbance.      Physical Exam Triage Vital Signs ED Triage Vitals  Encounter Vitals Group     BP 08/24/23 1403 (!) 151/80     Systolic BP Percentile --      Diastolic BP Percentile --      Pulse Rate 08/24/23 1403 (!) 59     Resp 08/24/23 1403 16      Temp 08/24/23 1403 98.5 F (36.9 C)     Temp Source 08/24/23 1403 Oral     SpO2 08/24/23 1403 98 %     Weight --      Height --      Head Circumference --      Peak Flow --      Pain Score 08/24/23 1407 0     Pain Loc --      Pain Education --      Exclude from Growth Chart --    No data found.  Updated Vital Signs BP (!) 151/80 (BP Location: Right Arm)   Pulse (!) 59   Temp 98.5 F (36.9 C) (Oral)   Resp 16   LMP 07/23/2016   SpO2 98%   Visual Acuity Right Eye Distance:   Left Eye Distance:   Bilateral Distance:    Right Eye Near:   Left Eye Near:    Bilateral Near:     Physical Exam Vitals and nursing note reviewed.  Constitutional:      General: She is not in acute distress.    Appearance: Normal appearance. She is not ill-appearing.  HENT:     Head: Normocephalic and atraumatic.     Right Ear: Ear canal normal. A middle ear effusion is present. There is no impacted cerumen. No foreign body. Tympanic membrane is bulging. Tympanic membrane is not injected, erythematous or retracted.     Left Ear: Tympanic membrane and ear canal normal.     Nose: No congestion or rhinorrhea.     Mouth/Throat:     Pharynx: No oropharyngeal exudate or posterior oropharyngeal erythema.  Eyes:     General: No scleral icterus.    Extraocular Movements: Extraocular movements intact.     Conjunctiva/sclera: Conjunctivae normal.  Cardiovascular:     Rate and Rhythm: Normal rate and regular rhythm.     Heart sounds: No murmur heard. Pulmonary:     Effort: Pulmonary effort is normal. No respiratory distress.     Breath sounds: Normal breath sounds. No wheezing or rales.  Musculoskeletal:     Cervical back: Normal range of motion. No rigidity.  Skin:    Coloration: Skin is not jaundiced.     Findings: No rash.  Neurological:     General: No focal deficit present.     Mental Status: She is alert and oriented to person, place, and time.     Motor: No weakness.     Gait: Gait  normal.  Psychiatric:        Mood and Affect: Mood normal.        Behavior: Behavior normal.      UC Treatments / Results  Labs (all labs ordered are listed, but only abnormal results are  displayed) Labs Reviewed - No data to display  EKG   Radiology No results found.  Procedures Procedures (including critical care time)  Medications Ordered in UC Medications - No data to display  Initial Impression / Assessment and Plan / UC Course  I have reviewed the triage vital signs and the nursing notes.  Pertinent labs & imaging results that were available during my care of the patient were reviewed by me and considered in my medical decision making (see chart for details).     No signs of infection, serous otitis media Use medication as discussed  Final Clinical Impressions(s) / UC Diagnoses   Final diagnoses:  Non-recurrent acute serous otitis media of right ear     Discharge Instructions      Follow up with PCP if no improvement in 1 week  Take flonase and sudafed as discussed    ED Prescriptions   None    PDMP not reviewed this encounter.   Evern Core, PA-C 08/24/23 1448

## 2023-08-24 NOTE — ED Triage Notes (Signed)
Pt reports right ear fullness x 2 weeks. Denies pain.

## 2024-03-05 ENCOUNTER — Emergency Department (HOSPITAL_BASED_OUTPATIENT_CLINIC_OR_DEPARTMENT_OTHER)
Admission: EM | Admit: 2024-03-05 | Discharge: 2024-03-05 | Disposition: A | Discharge status: Home / Self Care | Attending: Emergency Medicine | Admitting: Emergency Medicine

## 2024-03-05 ENCOUNTER — Other Ambulatory Visit: Payer: Self-pay

## 2024-03-05 ENCOUNTER — Encounter (HOSPITAL_BASED_OUTPATIENT_CLINIC_OR_DEPARTMENT_OTHER): Payer: Self-pay

## 2024-03-05 DIAGNOSIS — R6883 Chills (without fever): Secondary | ICD-10-CM | POA: Diagnosis not present

## 2024-03-05 DIAGNOSIS — M791 Myalgia, unspecified site: Secondary | ICD-10-CM | POA: Insufficient documentation

## 2024-03-05 DIAGNOSIS — R519 Headache, unspecified: Secondary | ICD-10-CM | POA: Diagnosis not present

## 2024-03-05 DIAGNOSIS — R1013 Epigastric pain: Secondary | ICD-10-CM | POA: Insufficient documentation

## 2024-03-05 DIAGNOSIS — R110 Nausea: Secondary | ICD-10-CM | POA: Diagnosis not present

## 2024-03-05 LAB — CBC WITH DIFFERENTIAL/PLATELET
Abs Immature Granulocytes: 0.08 10*3/uL — ABNORMAL HIGH (ref 0.00–0.07)
Basophils Absolute: 0.1 10*3/uL (ref 0.0–0.1)
Basophils Relative: 0 %
Eosinophils Absolute: 0 10*3/uL (ref 0.0–0.5)
Eosinophils Relative: 0 %
HCT: 38.9 % (ref 36.0–46.0)
Hemoglobin: 13.1 g/dL (ref 12.0–15.0)
Immature Granulocytes: 1 %
Lymphocytes Relative: 10 %
Lymphs Abs: 1.6 10*3/uL (ref 0.7–4.0)
MCH: 28.4 pg (ref 26.0–34.0)
MCHC: 33.7 g/dL (ref 30.0–36.0)
MCV: 84.2 fL (ref 80.0–100.0)
Monocytes Absolute: 0.9 10*3/uL (ref 0.1–1.0)
Monocytes Relative: 6 %
Neutro Abs: 13 10*3/uL — ABNORMAL HIGH (ref 1.7–7.7)
Neutrophils Relative %: 83 %
Platelets: 257 10*3/uL (ref 150–400)
RBC: 4.62 MIL/uL (ref 3.87–5.11)
RDW: 13.7 % (ref 11.5–15.5)
WBC: 15.6 10*3/uL — ABNORMAL HIGH (ref 4.0–10.5)
nRBC: 0 % (ref 0.0–0.2)

## 2024-03-05 LAB — URINALYSIS, ROUTINE W REFLEX MICROSCOPIC
Bilirubin Urine: NEGATIVE
Glucose, UA: NEGATIVE mg/dL
Hgb urine dipstick: NEGATIVE
Ketones, ur: NEGATIVE mg/dL
Leukocytes,Ua: NEGATIVE
Nitrite: NEGATIVE
Protein, ur: NEGATIVE mg/dL
Specific Gravity, Urine: 1.017 (ref 1.005–1.030)
pH: 6.5 (ref 5.0–8.0)

## 2024-03-05 LAB — COMPREHENSIVE METABOLIC PANEL WITH GFR
ALT: 15 U/L (ref 0–44)
AST: 20 U/L (ref 15–41)
Albumin: 4.1 g/dL (ref 3.5–5.0)
Alkaline Phosphatase: 66 U/L (ref 38–126)
Anion gap: 10 (ref 5–15)
BUN: 23 mg/dL — ABNORMAL HIGH (ref 6–20)
CO2: 26 mmol/L (ref 22–32)
Calcium: 9.2 mg/dL (ref 8.9–10.3)
Chloride: 100 mmol/L (ref 98–111)
Creatinine, Ser: 1.18 mg/dL — ABNORMAL HIGH (ref 0.44–1.00)
GFR, Estimated: 58 mL/min — ABNORMAL LOW (ref >60)
Glucose, Bld: 109 mg/dL — ABNORMAL HIGH (ref 70–99)
Potassium: 4.1 mmol/L (ref 3.5–5.1)
Sodium: 136 mmol/L (ref 135–145)
Total Bilirubin: 0.9 mg/dL (ref 0.0–1.2)
Total Protein: 7.2 g/dL (ref 6.5–8.1)

## 2024-03-05 LAB — LIPASE, BLOOD: Lipase: 14 U/L (ref 11–51)

## 2024-03-05 LAB — RESP PANEL BY RT-PCR (RSV, FLU A&B, COVID)  RVPGX2
Influenza A by PCR: NEGATIVE
Influenza B by PCR: NEGATIVE
Resp Syncytial Virus by PCR: NEGATIVE
SARS Coronavirus 2 by RT PCR: NEGATIVE

## 2024-03-05 MED ORDER — METOCLOPRAMIDE HCL 10 MG PO TABS: 10.0000 mg | ORAL_TABLET | Freq: Three times a day (TID) | ORAL | 0 refills | Status: AC | PRN

## 2024-03-05 MED ORDER — ONDANSETRON 4 MG PO TBDP: 4.0000 mg | ORAL_TABLET | Freq: Three times a day (TID) | ORAL | 0 refills | Status: AC | PRN

## 2024-03-05 MED ORDER — DOXYCYCLINE HYCLATE 100 MG PO CAPS
100.0000 mg | ORAL_CAPSULE | Freq: Two times a day (BID) | ORAL | 0 refills | Status: AC
Start: 2024-03-05 — End: 2024-03-12

## 2024-03-05 MED ORDER — METOCLOPRAMIDE HCL 5 MG/ML IJ SOLN
10.0000 mg | Freq: Once | INTRAMUSCULAR | Status: AC
  Administered 2024-03-05: 10 mg via INTRAVENOUS
  Filled 2024-03-05: qty 2

## 2024-03-05 NOTE — ED Provider Notes (Signed)
 [place] [facility]PARTMENT AT [facility] Provider Note   CSN: [id number] Arrival date & time: 03/05/24  1946     History  Chief Complaint  Patient presents with   Generalized Body Aches    Sarah Dyer is a 48 y.o. female presented to ED with abdominal pain, nausea, headache, generalized body pain.  Symptoms began within the past 24 hours.  She denies dysuria or hematuria.  She says she has had similar symptoms in the past when she has had a "infection".  She is on immunosuppressive medicine cyclosporine for renal transplant, as well as CellCept.  Most recent the patient was admitted to the hospital in August 2020 for very similar presentation of abdominal pain, nausea, elevated white blood cell count.  She had a transaminitis at the time of unclear etiology was admitted for observation with trending of her liver enzymes back to normal.  CT abdomen pelvis showed no acute abnormality.  Hepatitis panel was negative.  HPI     Home Medications Prior to Admission medications   Medication Sig Start Date End Date Taking? Authorizing Provider  doxycycline (VIBRAMYCIN) 100 MG capsule Take 1 capsule (100 mg total) by mouth 2 (two) times daily for 7 days. 03/05/24 03/12/24 Yes Jacque Byron, Kermit Balo, MD  metoCLOPramide (REGLAN) 10 MG tablet Take 1 tablet (10 mg total) by mouth every 8 (eight) hours as needed for up to 10 doses for nausea. 03/05/24  Yes Terald Sleeper, MD  ondansetron (ZOFRAN-ODT) 4 MG disintegrating tablet Take 1 tablet (4 mg total) by mouth every 8 (eight) hours as needed for up to 12 doses for nausea or vomiting. 03/05/24  Yes Glada Wickstrom, Kermit Balo, MD  acetaminophen (TYLENOL) 500 MG tablet Take 500 mg by mouth every 6 (six) hours as needed for pain.    [provider]  cycloSPORINE (SANDIMMUNE) 100 MG/ML solution Take 90 mg by mouth 2 (two) times daily.    [provider]  famotidine (PEPCID) 40 MG tablet Take 40 mg by mouth daily. 07/10/23    [provider]  magnesium oxide (MAG-OX) 400 MG tablet Take 400 mg by mouth daily. 10/21/19   [provider]  mycophenolate (CELLCEPT) 500 MG tablet Take 500 mg by mouth 3 (three) times daily.    [provider]  ondansetron (ZOFRAN-ODT) 4 MG disintegrating tablet Take 1 tablet (4 mg total) by mouth every 8 (eight) hours as needed for nausea or vomiting. Patient not taking: Reported on 07/28/2023 07/27/23   Raspet, Denny Peon K, PA-C  predniSONE (DELTASONE) 5 MG tablet Take 5 mg by mouth daily.    [provider]  sertraline (ZOLOFT) 50 MG tablet Take 50 mg by mouth daily. 07/10/23   [provider]  sucralfate (CARAFATE) 1 GM/10ML suspension Take 10 mLs (1 g total) by mouth 4 (four) times daily -  with meals and at bedtime. Patient not taking: Reported on 07/28/2023 07/27/23   Raspet, Noberto Retort, PA-C      Allergies    Cephalosporins and Heparin    Review of Systems   Review of Systems  Physical Exam Updated Vital Signs BP (!) 111/57   Pulse 95   Temp 98.8 F (37.1 C)   Resp 16   Ht 4\' 9"  (1.448 m)   Wt 70.8 kg   LMP 07/23/2016   SpO2 91%   BMI 33.76 kg/m  Physical Exam Constitutional:      General: She is not in acute distress. HENT:     Head:  Normocephalic and atraumatic.  Eyes:     Conjunctiva/sclera: Conjunctivae normal.     Pupils: Pupils are equal, round, and reactive to light.  Cardiovascular:     Rate and Rhythm: Normal rate and regular rhythm.  Pulmonary:     Effort: Pulmonary effort is normal. No respiratory distress.  Abdominal:     General: There is no distension.     Tenderness: There is abdominal tenderness.  Skin:    General: Skin is warm and dry.  Neurological:     General: No focal deficit present.     Mental Status: She is alert. Mental status is at baseline.  Psychiatric:        Mood and Affect: Mood normal.        Behavior: Behavior normal.     ED Results / Procedures / Treatments   Labs (all labs ordered  are listed, but only abnormal results are displayed) Labs Reviewed  COMPREHENSIVE METABOLIC PANEL WITH GFR - Abnormal; Notable for the following components:      Result Value   Glucose, Bld 109 (*)    BUN 23 (*)    Creatinine, Ser 1.18 (*)    GFR, Estimated 58 (*)    All other components within normal limits  CBC WITH DIFFERENTIAL/PLATELET - Abnormal; Notable for the following components:   WBC 15.6 (*)    Neutro Abs 13.0 (*)    Abs Immature Granulocytes 0.08 (*)    All other components within normal limits  RESP PANEL BY RT-PCR (RSV, FLU A&B, COVID)  RVPGX2  CULTURE, BLOOD (SINGLE)  LIPASE, BLOOD  URINALYSIS, ROUTINE W REFLEX MICROSCOPIC    EKG None  Radiology No results found.  Procedures Procedures    Medications Ordered in ED Medications  metoCLOPramide (REGLAN) injection 10 mg (10 mg Intravenous Given 03/05/24 2122)    ED Course/ Medical Decision Making/ A&P Clinical Course as of 03/05/24 2300  Wed Mar 05, 2024  2221 Patient reassessed reports complete resolution of the headache and abdominal pain after Reglan.  I do not see a clear source for her symptoms or the leukocytosis.  She has no abdominal pain at this time of a fairly low suspicion for acute biliary disease or need for CT scan [MT]    Clinical Course User Index [MT] Lea Baine, Kermit Balo, MD                                 Medical Decision Making Amount and/or Complexity of Data Reviewed Labs: ordered.  Risk Prescription drug management.   This patient presents to the ED with concern for abdominal pain, nausea, headache. This involves an extensive number of treatment options, and is a complaint that carries with it a high risk of complications and morbidity.  The differential diagnosis includes viral illness versus bacterial infection versus pancreatitis versus biliary disease versus other  Co-morbidities that complicate the patient evaluation: Immunosuppressed from renal transplant, at risk of  infection  Additional history obtained from patient's daughter at bedside  External records from outside source obtained and reviewed including hospitalization discharge summary and records from August 2024  I ordered and personally interpreted labs.  The pertinent results include: White blood cell count 15.6.  UA without sign of infection.  No transaminitis.  Lipase within normal limits.  COVID and flu negative.  Creatinine 1.1.  Blood culture set to be sent off  I ordered medication including IV Reglan for headache and  nausea and abdominal pain  I have reviewed the patients home medicines and have made adjustments as needed  Test Considered: Given that the patient had resolution of her headache and abdominal pain with the Reglan, and a lower suspicion for acute intra-abdominal inflammatory or infectious emergency.  I did not feel she needed a CT scan of the abdomen at this time or gallbladder ultrasound.  (One year ago during prior hospitalization she had similar symptoms, and her CT imaging was also unremarkable.)  However, I did discuss very close return precautions with the patient and her daughter at bedside.  They understand that if her symptoms return or worsen, she will likely require advanced imaging at that time, needs to return to the hospital.  Their preference is to go home because she is feeling better and tolerating p.o.  This is reasonable.  She is immunosuppressed, and I will send a set of blood cultures, but there is no clear nidus for bacteremia/ sepsis.  I also provided a prescription for an antibiotic if she does develop fevers at home.  After the interventions noted above, I reevaluated the patient and found that they have: improved   Dispostion:  After consideration of the diagnostic results and the patients response to treatment, I feel that the patent would benefit from close outpatient follow up.         Final Clinical Impression(s) / ED Diagnoses Final  diagnoses:  Epigastric pain  Chills    Rx / DC Orders ED Discharge Orders          Ordered    ondansetron (ZOFRAN-ODT) 4 MG disintegrating tablet  Every 8 hours PRN        03/05/24 2250    metoCLOPramide (REGLAN) 10 MG tablet  Every 8 hours PRN        03/05/24 2250    doxycycline (VIBRAMYCIN) 100 MG capsule  2 times daily        03/05/24 2250              Terald Sleeper, MD 03/05/24 2300

## 2024-03-05 NOTE — ED Triage Notes (Signed)
 Pt arrives with daughter who reports patient woke up this morning with body aches, cold chills, denies NVD, some stomach pains. Did take a tylenol at home. Stomach pain started Saturday.

## 2024-03-05 NOTE — Discharge Instructions (Addendum)
 IMPORTANT:  You had blood culture test sent to the lab today.  If your blood culture is positive, and growing bacteria, you will receive a phone call telling you to come immediately back to the hospital.  You will likely need antibiotics and admission at that time.  If you do begin having fevers at home, I prescribed you a strong antibiotic that you should start taking at home with food.  If you have worsening pain in the abdomen, persistent vomiting, dizziness, loss of consciousness, please call 911 or return to the hospital.

## 2024-03-06 ENCOUNTER — Ambulatory Visit: Payer: Self-pay

## 2024-03-11 LAB — CULTURE, BLOOD (SINGLE)
Culture: NO GROWTH
Special Requests: ADEQUATE

## 2024-07-03 ENCOUNTER — Other Ambulatory Visit: Payer: Self-pay | Admitting: Family Medicine

## 2024-07-03 DIAGNOSIS — Z1231 Encounter for screening mammogram for malignant neoplasm of breast: Secondary | ICD-10-CM

## 2024-07-16 ENCOUNTER — Ambulatory Visit
Admission: RE | Admit: 2024-07-16 | Discharge: 2024-07-16 | Disposition: A | Discharge status: Home / Self Care | Source: Ambulatory Visit | Attending: Family Medicine | Admitting: Family Medicine

## 2024-07-16 DIAGNOSIS — Z1231 Encounter for screening mammogram for malignant neoplasm of breast: Secondary | ICD-10-CM

## 2024-11-16 ENCOUNTER — Ambulatory Visit
Admission: EM | Admit: 2024-11-16 | Discharge: 2024-11-16 | Disposition: A | Discharge status: Home / Self Care | Attending: Family Medicine | Admitting: Family Medicine

## 2024-11-16 DIAGNOSIS — J029 Acute pharyngitis, unspecified: Secondary | ICD-10-CM

## 2024-11-16 DIAGNOSIS — J069 Acute upper respiratory infection, unspecified: Secondary | ICD-10-CM | POA: Diagnosis not present

## 2024-11-16 LAB — POC SOFIA SARS ANTIGEN FIA: SARS Coronavirus 2 Ag: NEGATIVE

## 2024-11-16 LAB — POCT RAPID STREP A (OFFICE): Rapid Strep A Screen: NEGATIVE

## 2024-11-16 MED ORDER — BENZONATATE 100 MG PO CAPS: 100.0000 mg | ORAL_CAPSULE | Freq: Three times a day (TID) | ORAL | 0 refills | Status: AC

## 2024-11-16 NOTE — ED Triage Notes (Signed)
 Pt presents to UC for c/o runny nose, congestion, sore throat, cough x4 days. Denies fevers. Has taken tylenol 

## 2024-11-16 NOTE — Discharge Instructions (Addendum)
 Tested negative for COVID and strep throat. Please treat your symptoms with over the counter tylenol  or ibuprofen , humidifier, and rest. Viral illnesses can last 7-14 days. Please follow up with your PCP if your symptoms are not improving. Please go to the ER for any worsening symptoms. This includes but is not limited to fever you can not control with tylenol  or ibuprofen , you are not able to stay hydrated, you have shortness of breath or chest pain.  Thank you for choosing [facility] for your healthcare needs. I hope you feel better soon!

## 2024-11-16 NOTE — ED Provider Notes (Signed)
 [facility]    CSN: [id number] Arrival date & time: 11/16/24  1152      History   Chief Complaint Chief Complaint  Patient presents with   Cough   Nasal Congestion    HPI Trivia Sarah Dyer is a 48 y.o. female  presents for evaluation of URI symptoms for 4 days. Patient reports associated symptoms of cough, congestion, sore throat laryngitis. Denies N/V/D, fevers, ear pain, bodyaches, shortness of breath. Patient does not have a hx of asthma. Patient is not an active smoker.   Reports sick contacts at [facility].  Pt has taken Tylenol  OTC for symptoms. Pt has no other concerns at this time.   HPI  Past Medical History:  Diagnosis Date   Chronic kidney disease    Gastritis     Patient Active Problem List   Diagnosis Date Noted   Elevated transaminase level 07/28/2023   HLD (hyperlipidemia) 07/28/2023   Immunosuppression 07/24/2016   SIRS (systemic inflammatory response syndrome) (HCC) 07/23/2016   Epigastric pain 07/31/2013   Abnormal ECG 07/31/2013   Abdominal pain 07/24/2013   Nausea & vomiting 07/24/2013   Gastroenteritis 07/24/2013   Renal transplant, status post 07/24/2013   Renal failure 01/31/2007    Past Surgical History:  Procedure Laterality Date   CHOLECYSTECTOMY     KIDNEY TRANSPLANT  January2009   TUBAL LIGATION      OB History   No obstetric history on file.      Home Medications    Prior to Admission medications  Medication Sig Start Date End Date Taking? Authorizing Provider  acetaminophen  (TYLENOL ) 500 MG tablet Take 500 mg by mouth every 6 (six) hours as needed for pain.   Yes [provider]  benzonatate  (TESSALON ) 100 MG capsule Take 1 capsule (100 mg total) by mouth every 8 (eight) hours. 11/16/24  Yes Keithen Capo, Jodi R, NP  cycloSPORINE  (SANDIMMUNE ) 100 MG/ML solution Take 90 mg by mouth 2 (two) times daily.   Yes [provider]  famotidine  (PEPCID ) 40 MG tablet Take 40 mg by mouth daily. 07/10/23  Yes  [provider]  magnesium oxide (MAG-OX) 400 MG tablet Take 400 mg by mouth daily. 10/21/19  Yes [provider]  mycophenolate  (CELLCEPT ) 500 MG tablet Take 500 mg by mouth 3 (three) times daily.   Yes [provider]  predniSONE  (DELTASONE ) 5 MG tablet Take 5 mg by mouth daily.   Yes [provider]  sertraline  (ZOLOFT ) 50 MG tablet Take 50 mg by mouth daily. 07/10/23  Yes [provider]  metoCLOPramide  (REGLAN ) 10 MG tablet Take 1 tablet (10 mg total) by mouth every 8 (eight) hours as needed for up to 10 doses for nausea. 03/05/24   Cottie Donnice PARAS, MD  ondansetron  (ZOFRAN -ODT) 4 MG disintegrating tablet Take 1 tablet (4 mg total) by mouth every 8 (eight) hours as needed for nausea or vomiting. Patient not taking: Reported on 07/28/2023 07/27/23   Raspet, Erin K, PA-C  ondansetron  (ZOFRAN -ODT) 4 MG disintegrating tablet Take 1 tablet (4 mg total) by mouth every 8 (eight) hours as needed for up to 12 doses for nausea or vomiting. 03/05/24   Cottie Donnice PARAS, MD  sucralfate  (CARAFATE ) 1 GM/10ML suspension Take 10 mLs (1 g total) by mouth 4 (four) times daily -  with meals and at bedtime. Patient not taking: Reported on 07/28/2023 07/27/23   Raspet, Rocky POUR, PA-C    Family History History reviewed. No pertinent family history.  Social History  Social History[1]   Allergies   Cephalosporins and Heparin    Review of Systems Review of Systems  HENT:  Positive for congestion and sore throat.        Laryngitis  Respiratory:  Positive for cough.      Physical Exam Triage Vital Signs ED Triage Vitals  Encounter Vitals Group     BP 11/16/24 1215 135/84     Girls Systolic BP Percentile --      Girls Diastolic BP Percentile --      Boys Systolic BP Percentile --      Boys Diastolic BP Percentile --      Pulse Rate 11/16/24 1215 77     Resp 11/16/24 1215 18     Temp 11/16/24 1215 98 F (36.7 C)     Temp Source 11/16/24 1215 Oral     SpO2  11/16/24 1215 97 %     Weight --      Height --      Head Circumference --      Peak Flow --      Pain Score 11/16/24 1212 5     Pain Loc --      Pain Education --      Exclude from Growth Chart --    No data found.  Updated Vital Signs BP 135/84 (BP Location: Right Arm)   Pulse 77   Temp 98 F (36.7 C) (Oral)   Resp 18   LMP 07/23/2016   SpO2 97%   Visual Acuity Right Eye Distance:   Left Eye Distance:   Bilateral Distance:    Right Eye Near:   Left Eye Near:    Bilateral Near:     Physical Exam Vitals and nursing note reviewed.  Constitutional:      General: She is not in acute distress.    Appearance: She is well-developed. She is not ill-appearing.  HENT:     Head: Normocephalic and atraumatic.     Right Ear: Tympanic membrane and ear canal normal.     Left Ear: Tympanic membrane and ear canal normal.     Nose: Congestion present.     Mouth/Throat:     Mouth: Mucous membranes are moist.     Pharynx: Oropharynx is clear. Uvula midline. Posterior oropharyngeal erythema present.     Tonsils: No tonsillar exudate or tonsillar abscesses.  Eyes:     Conjunctiva/sclera: Conjunctivae normal.     Pupils: Pupils are equal, round, and reactive to light.  Cardiovascular:     Rate and Rhythm: Normal rate and regular rhythm.     Heart sounds: Normal heart sounds.  Pulmonary:     Effort: Pulmonary effort is normal.     Breath sounds: Normal breath sounds. No wheezing, rhonchi or rales.  Musculoskeletal:     Cervical back: Normal range of motion and neck supple.  Lymphadenopathy:     Cervical: No cervical adenopathy.  Skin:    General: Skin is warm and dry.  Neurological:     General: No focal deficit present.     Mental Status: She is alert and oriented to person, place, and time.  Psychiatric:        Mood and Affect: Mood normal.        Behavior: Behavior normal.      UC Treatments / Results  Labs (all labs ordered are listed, but only abnormal results are  displayed) Labs Reviewed  POC SOFIA SARS ANTIGEN FIA  POCT RAPID STREP A (OFFICE)  EKG   Radiology No results found.  Procedures Procedures (including critical care time)  Medications Ordered in UC Medications - No data to display  Initial Impression / Assessment and Plan / UC Course  I have reviewed the triage vital signs and the nursing notes.  Pertinent labs & imaging results that were available during my care of the patient were reviewed by me and considered in my medical decision making (see chart for details).     Negative COVID and strep throat testing.  Discussed viral illness and symptomatic treatment.  Tessalon  as needed for cough.  Encouraged salt water gargles and warm liquids as well as rest and fluids.  PCP follow-up if symptoms do not improve.  ER precautions reviewed. Final Clinical Impressions(s) / UC Diagnoses   Final diagnoses:  Sore throat  Viral upper respiratory illness     Discharge Instructions      Tested negative for COVID and strep throat. Please treat your symptoms with over the counter tylenol  or ibuprofen , humidifier, and rest. Viral illnesses can last 7-14 days. Please follow up with your PCP if your symptoms are not improving. Please go to the ER for any worsening symptoms. This includes but is not limited to fever you can not control with tylenol  or ibuprofen , you are not able to stay hydrated, you have shortness of breath or chest pain.  Thank you for choosing [facility] for your healthcare needs. I hope you feel better soon!      ED Prescriptions     Medication Sig Dispense Auth. Provider   benzonatate  (TESSALON ) 100 MG capsule Take 1 capsule (100 mg total) by mouth every 8 (eight) hours. 21 capsule Jermarion Poffenberger, Jodi R, NP      PDMP not reviewed this encounter.     [1]  Social History Tobacco Use   Smoking status: Never   Smokeless tobacco: Never  Vaping Use   Vaping status: Never Used  Substance Use Topics   Alcohol use:  No   Drug use: Never     Sarah Myla SAUNDERS, NP 11/16/24 1311

## 2024-12-30 ENCOUNTER — Ambulatory Visit
Admission: EM | Admit: 2024-12-30 | Discharge: 2024-12-30 | Disposition: A | Discharge status: Home / Self Care | Attending: Family Medicine | Admitting: Family Medicine

## 2024-12-30 DIAGNOSIS — R350 Frequency of micturition: Secondary | ICD-10-CM | POA: Insufficient documentation

## 2024-12-30 DIAGNOSIS — Z940 Kidney transplant status: Secondary | ICD-10-CM | POA: Insufficient documentation

## 2024-12-30 DIAGNOSIS — N3001 Acute cystitis with hematuria: Secondary | ICD-10-CM | POA: Insufficient documentation

## 2024-12-30 LAB — POCT URINE DIPSTICK
Bilirubin, UA: NEGATIVE
Glucose, UA: NEGATIVE mg/dL
Ketones, POC UA: NEGATIVE mg/dL
Nitrite, UA: POSITIVE — AB
Protein Ur, POC: 100 mg/dL — AB
Spec Grav, UA: 1.015
Urobilinogen, UA: 0.2 U/dL
pH, UA: 6

## 2024-12-30 MED ORDER — SULFAMETHOXAZOLE-TRIMETHOPRIM 800-160 MG PO TABS: 1.0000 | ORAL_TABLET | Freq: Two times a day (BID) | ORAL | 0 refills | Status: DC

## 2024-12-30 NOTE — ED Provider Notes (Signed)
 " [facility]  Note:  This document was prepared using Conservation officer, historic buildings and may include unintentional dictation errors.  MRN: [id number] DOB: 1976-01-02  Subjective:   Sarah Dyer is a 49 y.o. female with PMH of renal transplant secondary to renal failure presenting for 4-day history of intermittent malaise, chills, fever, dysuria.  The renal transplant was 17 years ago.  Patient has done really well hydrating and avoiding urinary irritants.  Unfortunately, last weekend she drank a soda and thereafter her symptoms started.  Current Outpatient Medications  Medication Instructions   acetaminophen  (TYLENOL ) 500 mg, Every 6 hours PRN   benzonatate  (TESSALON ) 100 mg, Oral, Every 8 hours   cycloSPORINE  (SANDIMMUNE ) 90 mg, 2 times daily   famotidine  (PEPCID ) 40 mg, Daily   magnesium  oxide (MAG-OX) 400 mg, Daily   metoCLOPramide  (REGLAN ) 10 mg, Oral, Every 8 hours PRN   mycophenolate  (CELLCEPT ) 500 mg, 3 times daily   ondansetron  (ZOFRAN -ODT) 4 mg, Oral, Every 8 hours PRN   ondansetron  (ZOFRAN -ODT) 4 mg, Oral, Every 8 hours PRN   predniSONE  (DELTASONE ) 5 mg, Daily   sertraline  (ZOLOFT ) 50 mg, Daily   sucralfate  (CARAFATE ) 1 g, Oral, 3 times daily with meals & bedtime    Allergies[1]  Past Medical History:  Diagnosis Date   Chronic kidney disease    Gastritis      Past Surgical History:  Procedure Laterality Date   CHOLECYSTECTOMY     KIDNEY TRANSPLANT  January2009   TUBAL LIGATION      No family history on file.  Social History   Occupational History   Not on file  Tobacco Use   Smoking status: Never   Smokeless tobacco: Never  Vaping Use   Vaping status: Never Used  Substance and Sexual Activity   Alcohol use: No   Drug use: Never   Sexual activity: Not Currently     ROS   Objective:   Vitals: BP 118/67 (BP Location: Right Arm)   Pulse 78   Temp 98 F (36.7 C) (Oral)   Resp 20   LMP 07/23/2016   SpO2  98%   Physical Exam Constitutional:      General: She is not in acute distress.    Appearance: Normal appearance. She is well-developed. She is not ill-appearing, toxic-appearing or diaphoretic.  HENT:     Head: Normocephalic and atraumatic.     Nose: Nose normal.     Mouth/Throat:     Mouth: Mucous membranes are moist.  Eyes:     General: No scleral icterus.       Right eye: No discharge.        Left eye: No discharge.     Extraocular Movements: Extraocular movements intact.     Conjunctiva/sclera: Conjunctivae normal.  Cardiovascular:     Rate and Rhythm: Normal rate.  Pulmonary:     Effort: Pulmonary effort is normal.  Abdominal:     General: Bowel sounds are normal. There is no distension.     Palpations: Abdomen is soft. There is no mass.     Tenderness: There is no abdominal tenderness. There is no right CVA tenderness, left CVA tenderness, guarding or rebound.  Skin:    General: Skin is warm and dry.  Neurological:     General: No focal deficit present.     Mental Status: She is alert and oriented to person, place, and time.  Psychiatric:        Mood and Affect:  Mood normal.        Behavior: Behavior normal.        Thought Content: Thought content normal.        Judgment: Judgment normal.     Results for orders placed or performed during the hospital encounter of 12/30/24 (from the past 24 hours)  POCT URINE DIPSTICK     Status: Abnormal   Collection Time: 12/30/24 11:50 AM  Result Value Ref Range   Color, UA yellow yellow   Clarity, UA cloudy (A) clear   Glucose, UA negative negative mg/dL   Bilirubin, UA negative negative   Ketones, POC UA negative negative mg/dL   Spec Grav, UA 8.984 8.989 - 1.025   Blood, UA large (A) negative   pH, UA 6.0 5.0 - 8.0   Protein Ur, POC =100 (A) negative mg/dL   Urobilinogen, UA 0.2 0.2 or 1.0 E.U./dL   Nitrite, UA Positive (A) Negative   Leukocytes, UA Moderate (2+) (A) Negative    Assessment and Plan :   PDMP not  reviewed this encounter.  1. Acute cystitis with hematuria   2. Urinary frequency   3. Kidney transplant recipient      CrCl 54mL/min - 59mL/min.  Start Bactrim .  Urine culture pending.  Avoid urinary irritants.  Hydrate with plain water.  Counseled patient on potential for adverse effects with medications prescribed/recommended today, ER and return-to-clinic precautions discussed, patient verbalized understanding.      [1]  Allergies Allergen Reactions   Cephalosporins Anaphylaxis   Heparin  Itching    Pt reports she had an injection in the hospital 2 weeks ago and she was itching all over.      Christopher Savannah, PA-C 12/30/24 1219  "

## 2024-12-30 NOTE — ED Triage Notes (Signed)
 Pt c/o intermittent dysuria, chills x 4 days-denies fever-NAD-steady gait

## 2024-12-31 ENCOUNTER — Other Ambulatory Visit: Payer: Self-pay

## 2024-12-31 ENCOUNTER — Encounter (HOSPITAL_BASED_OUTPATIENT_CLINIC_OR_DEPARTMENT_OTHER): Payer: Self-pay

## 2024-12-31 ENCOUNTER — Inpatient Hospital Stay (HOSPITAL_BASED_OUTPATIENT_CLINIC_OR_DEPARTMENT_OTHER)
Admission: EM | Admit: 2024-12-31 | Discharge: 2025-01-07 | DRG: 872 | Disposition: A | Attending: Internal Medicine | Admitting: Internal Medicine

## 2024-12-31 DIAGNOSIS — Z881 Allergy status to other antibiotic agents status: Secondary | ICD-10-CM

## 2024-12-31 DIAGNOSIS — Z9049 Acquired absence of other specified parts of digestive tract: Secondary | ICD-10-CM

## 2024-12-31 DIAGNOSIS — I129 Hypertensive chronic kidney disease with stage 1 through stage 4 chronic kidney disease, or unspecified chronic kidney disease: Secondary | ICD-10-CM | POA: Diagnosis present

## 2024-12-31 DIAGNOSIS — E785 Hyperlipidemia, unspecified: Secondary | ICD-10-CM | POA: Diagnosis present

## 2024-12-31 DIAGNOSIS — Z6833 Body mass index (BMI) 33.0-33.9, adult: Secondary | ICD-10-CM

## 2024-12-31 DIAGNOSIS — N179 Acute kidney failure, unspecified: Secondary | ICD-10-CM | POA: Diagnosis not present

## 2024-12-31 DIAGNOSIS — N183 Chronic kidney disease, stage 3 unspecified: Secondary | ICD-10-CM | POA: Insufficient documentation

## 2024-12-31 DIAGNOSIS — A4151 Sepsis due to Escherichia coli [E. coli]: Principal | ICD-10-CM | POA: Diagnosis present

## 2024-12-31 DIAGNOSIS — E669 Obesity, unspecified: Secondary | ICD-10-CM | POA: Diagnosis present

## 2024-12-31 DIAGNOSIS — N3000 Acute cystitis without hematuria: Secondary | ICD-10-CM | POA: Diagnosis present

## 2024-12-31 DIAGNOSIS — D849 Immunodeficiency, unspecified: Secondary | ICD-10-CM | POA: Diagnosis present

## 2024-12-31 DIAGNOSIS — D84821 Immunodeficiency due to drugs: Secondary | ICD-10-CM | POA: Diagnosis present

## 2024-12-31 DIAGNOSIS — Z888 Allergy status to other drugs, medicaments and biological substances status: Secondary | ICD-10-CM

## 2024-12-31 DIAGNOSIS — N3001 Acute cystitis with hematuria: Principal | ICD-10-CM | POA: Diagnosis present

## 2024-12-31 DIAGNOSIS — Z79624 Long term (current) use of inhibitors of nucleotide synthesis: Secondary | ICD-10-CM

## 2024-12-31 DIAGNOSIS — K219 Gastro-esophageal reflux disease without esophagitis: Secondary | ICD-10-CM | POA: Diagnosis present

## 2024-12-31 DIAGNOSIS — T8619 Other complication of kidney transplant: Secondary | ICD-10-CM | POA: Diagnosis not present

## 2024-12-31 DIAGNOSIS — N1831 Chronic kidney disease, stage 3a: Secondary | ICD-10-CM | POA: Diagnosis present

## 2024-12-31 DIAGNOSIS — A419 Sepsis, unspecified organism: Secondary | ICD-10-CM

## 2024-12-31 DIAGNOSIS — Y830 Surgical operation with transplant of whole organ as the cause of abnormal reaction of the patient, or of later complication, without mention of misadventure at the time of the procedure: Secondary | ICD-10-CM | POA: Diagnosis not present

## 2024-12-31 DIAGNOSIS — Z79899 Other long term (current) drug therapy: Secondary | ICD-10-CM

## 2024-12-31 DIAGNOSIS — Z940 Kidney transplant status: Secondary | ICD-10-CM

## 2024-12-31 DIAGNOSIS — Z1629 Resistance to other single specified antibiotic: Secondary | ICD-10-CM | POA: Diagnosis present

## 2024-12-31 DIAGNOSIS — Z603 Acculturation difficulty: Secondary | ICD-10-CM | POA: Diagnosis present

## 2024-12-31 DIAGNOSIS — R519 Headache, unspecified: Secondary | ICD-10-CM | POA: Diagnosis present

## 2024-12-31 DIAGNOSIS — E871 Hypo-osmolality and hyponatremia: Secondary | ICD-10-CM | POA: Diagnosis present

## 2024-12-31 DIAGNOSIS — Z880 Allergy status to penicillin: Secondary | ICD-10-CM

## 2024-12-31 LAB — LACTIC ACID, PLASMA
Lactic Acid, Venous: 1.1 mmol/L (ref 0.5–1.9)
Lactic Acid, Venous: 0.8 mmol/L (ref 0.5–1.9)

## 2024-12-31 LAB — URINALYSIS, W/ REFLEX TO CULTURE (INFECTION SUSPECTED)
Bilirubin Urine: NEGATIVE
Glucose, UA: NEGATIVE mg/dL
Hgb urine dipstick: NEGATIVE
Ketones, ur: NEGATIVE mg/dL
Nitrite: NEGATIVE
Specific Gravity, Urine: 1.014 (ref 1.005–1.030)
pH: 7 (ref 5.0–8.0)

## 2024-12-31 LAB — COMPREHENSIVE METABOLIC PANEL WITH GFR
ALT: 27 U/L (ref 0–44)
AST: 34 U/L (ref 15–41)
Albumin: 4.5 g/dL (ref 3.5–5.0)
Alkaline Phosphatase: 102 U/L (ref 38–126)
Anion gap: 13 (ref 5–15)
BUN: 21 mg/dL — ABNORMAL HIGH (ref 6–20)
CO2: 25 mmol/L (ref 22–32)
Calcium: 9.5 mg/dL (ref 8.9–10.3)
Chloride: 92 mmol/L — ABNORMAL LOW (ref 98–111)
Creatinine, Ser: 1.43 mg/dL — ABNORMAL HIGH (ref 0.44–1.00)
GFR, Estimated: 45 mL/min — ABNORMAL LOW
Glucose, Bld: 113 mg/dL — ABNORMAL HIGH (ref 70–99)
Potassium: 4.1 mmol/L (ref 3.5–5.1)
Sodium: 129 mmol/L — ABNORMAL LOW (ref 135–145)
Total Bilirubin: 0.9 mg/dL (ref 0.0–1.2)
Total Protein: 7.5 g/dL (ref 6.5–8.1)

## 2024-12-31 LAB — CBC WITH DIFFERENTIAL/PLATELET
Abs Immature Granulocytes: 0.09 10*3/uL — ABNORMAL HIGH (ref 0.00–0.07)
Basophils Absolute: 0.1 10*3/uL (ref 0.0–0.1)
Basophils Relative: 1 %
Eosinophils Absolute: 0 10*3/uL (ref 0.0–0.5)
Eosinophils Relative: 0 %
HCT: 35.4 % — ABNORMAL LOW (ref 36.0–46.0)
Hemoglobin: 12.4 g/dL (ref 12.0–15.0)
Immature Granulocytes: 1 %
Lymphocytes Relative: 9 %
Lymphs Abs: 0.9 10*3/uL (ref 0.7–4.0)
MCH: 28.4 pg (ref 26.0–34.0)
MCHC: 35 g/dL (ref 30.0–36.0)
MCV: 81.2 fL (ref 80.0–100.0)
Monocytes Absolute: 0.8 10*3/uL (ref 0.1–1.0)
Monocytes Relative: 8 %
Neutro Abs: 8.6 10*3/uL — ABNORMAL HIGH (ref 1.7–7.7)
Neutrophils Relative %: 81 %
Platelets: 300 10*3/uL (ref 150–400)
RBC: 4.36 MIL/uL (ref 3.87–5.11)
RDW: 13.5 % (ref 11.5–15.5)
WBC: 10.5 10*3/uL (ref 4.0–10.5)
nRBC: 0 % (ref 0.0–0.2)

## 2024-12-31 LAB — PROTIME-INR
INR: 1 (ref 0.8–1.2)
Prothrombin Time: 13.2 s (ref 11.4–15.2)

## 2024-12-31 MED ORDER — ACETAMINOPHEN 325 MG PO TABS
650.0000 mg | ORAL_TABLET | Freq: Once | ORAL | Status: AC
  Administered 2024-12-31: 650 mg via ORAL
  Filled 2024-12-31: qty 2

## 2024-12-31 MED ORDER — SODIUM CHLORIDE 0.9 % IV BOLUS
1000.0000 mL | Freq: Once | INTRAVENOUS | Status: AC
  Administered 2024-12-31: 1000 mL via INTRAVENOUS

## 2024-12-31 MED ORDER — IBUPROFEN 200 MG PO TABS
200.0000 mg | ORAL_TABLET | Freq: Once | ORAL | Status: AC
  Administered 2024-12-31: 200 mg via ORAL
  Filled 2024-12-31: qty 1

## 2024-12-31 MED ORDER — PREDNISONE 5 MG PO TABS
5.0000 mg | ORAL_TABLET | Freq: Every day | ORAL | Status: DC
  Administered 2025-01-01 – 2025-01-07 (×7): 5 mg via ORAL
  Filled 2024-12-31 (×7): qty 1

## 2024-12-31 MED ORDER — CYCLOSPORINE 100 MG/ML PO SOLN
90.0000 mg | Freq: Two times a day (BID) | ORAL | Status: DC
  Administered 2025-01-01 – 2025-01-07 (×13): 90 mg via ORAL
  Filled 2024-12-31 (×15): qty 0.9

## 2024-12-31 MED ORDER — ACETAMINOPHEN 325 MG PO TABS
650.0000 mg | ORAL_TABLET | Freq: Four times a day (QID) | ORAL | Status: DC | PRN
  Administered 2025-01-01 (×3): 650 mg via ORAL
  Filled 2024-12-31 (×3): qty 2

## 2024-12-31 MED ORDER — CIPROFLOXACIN IN D5W 400 MG/200ML IV SOLN
400.0000 mg | Freq: Once | INTRAVENOUS | Status: AC
  Administered 2024-12-31: 400 mg via INTRAVENOUS
  Filled 2024-12-31: qty 200

## 2024-12-31 MED ORDER — MYCOPHENOLATE MOFETIL 500 MG PO TABS
500.0000 mg | ORAL_TABLET | Freq: Three times a day (TID) | ORAL | Status: DC
  Filled 2024-12-31 (×2): qty 2

## 2024-12-31 MED ORDER — ONDANSETRON HCL 4 MG/2ML IJ SOLN
4.0000 mg | Freq: Four times a day (QID) | INTRAMUSCULAR | Status: DC | PRN
  Administered 2025-01-02: 4 mg via INTRAVENOUS
  Filled 2024-12-31: qty 2

## 2024-12-31 NOTE — Plan of Care (Signed)
 [facility] emergency department to [facility] or [facility] transfer to telemetry bed:  49 year old female past medical history of renal transplant, immunosuppressive, CKD stage III, HLD, depression, GERD  presented to emergency department complaining of fever and UTI symptoms.  Patient has been taking antibiotics without much improvement.  At presentation to ED patient is hemodynamically stable except low-grade fever. Lab work, CBC unremarkable.  CMP showing low sodium 129, creatinine 1.43 and GFR 45 renal function at baseline.  Normal lactic acid level.  Blood cultures are in process.  UA showing evidence of UTI pending urine culture.  In the ED patient received 1 L of NS bolus and  ciprofloxacin  400 mg.  Patient has history of anaphylaxis with ceftriaxone in the past.   Hospitalist consulted for further evaluation management of acute cystitis.  [facility] will assume care on arrival to accepting facility. Until arrival, care as per EDP. However, [facility] available 24/7 for questions and assistance. Check [email or web address] for on-call coverage. Nursing staff, please call [facility] Admits & Consults System-Wide number under Amion on patient's arrival so appropriate admitting provider can evaluate the pt.   Author: Jarius Dieudonne, MD  [facility]

## 2024-12-31 NOTE — ED Notes (Signed)
 Pt ambulated to restroom & back to treatment room independently with steady gait.

## 2024-12-31 NOTE — ED Provider Notes (Signed)
 " [place] EMERGENCY DEPARTMENT AT [facility] Provider Note   CSN: [id number] Arrival date & time: 12/31/24  1714     Patient presents with: Urinary Tract Infection and Fever   Sarah Dyer is a 49 y.o. female.  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 Transplant was 17 years ago.  Patient is here for evaluation of fevers, fatigue, decreased appetite, and recent diagnosis of UTI at urgent care yesterday.  Patient with pertinent medical history of renal transplant secondary to renal failure, 17 years ago.  Patient is accompanied by her daughter today who facilitated her visit by translating Spanish.  She sees her nephrologist every 3 months.  She last saw her nephrologist last week prior to symptom onset and everything was normal with no concerns.  They attempted to call the nephrologist office earlier today for further guidance and due to current snowy conditions their office was closed and they were told it may be 72 hours before the office would respond; due to this delay and continued fever and fatigue they decided to come in for evaluation at our facility.   Patient was evaluated at local urgent care yesterday for 4-day history of intermittent malaise, chills, fever, and dysuria.  Treated for acute cystitis with hematuria and started on Bactrim .  Urine culture sent and pending.  Vitals at that visit included BP 118/67, pulse rate 78, temperature 64F, respirations 20, and SpO2 98% on room air.  Physical exam with no CVA tenderness.  Patient reports initial symptoms started this past Friday (1/23). Patient has taken two doses of prescribed antibiotic thus far.  She reports improvement of foul-smelling urine though not complete resolution today.  She was previously having dysuria and urinary frequency.  Dysuria has resolved though she continues to have urinary frequency.  She denies any abdominal pain, chest pain,  shortness of breath, dizziness, or syncope.  She denies nausea or vomiting though she has had a decreased appetite.  Last took Tylenol  yesterday evening. Patient has not had a UTI since renal transplant however she has had several bacteremias. She cannot recall ever being given ciprofloxacin  or any flouroquinolones. She cannot recall being given aztreonam. She cannot recall which cephalosporin she had an allergic reaction to.  The history is provided by the patient and a relative. The history is limited by a language barrier. A language interpreter was used (Patient's daughter).  Urinary Tract Infection Associated symptoms: fever   Fever      Prior to Admission medications  Medication Sig Start Date End Date Taking? Authorizing Provider  acetaminophen  (TYLENOL ) 500 MG tablet Take 500 mg by mouth every 6 (six) hours as needed for pain.    [provider]  benzonatate  (TESSALON ) 100 MG capsule Take 1 capsule (100 mg total) by mouth every 8 (eight) hours. 11/16/24   Mayer, Jodi R, NP  cycloSPORINE  (SANDIMMUNE ) 100 MG/ML solution Take 90 mg by mouth 2 (two) times daily.    [provider]  famotidine  (PEPCID ) 40 MG tablet Take 40 mg by mouth daily. 07/10/23   [provider]  magnesium  oxide (MAG-OX) 400 MG tablet Take 400 mg by mouth daily. 10/21/19   [provider]  metoCLOPramide  (REGLAN ) 10 MG tablet Take 1 tablet (10 mg total) by mouth every 8 (eight) hours as needed for up to 10 doses for nausea. 03/05/24   Cottie Donnice PARAS, MD  mycophenolate  (CELLCEPT ) 500 MG tablet Take 500 mg by mouth 3 (three) times  daily.    [provider]  ondansetron  (ZOFRAN -ODT) 4 MG disintegrating tablet Take 1 tablet (4 mg total) by mouth every 8 (eight) hours as needed for nausea or vomiting. Patient not taking: Reported on 07/28/2023 07/27/23   Raspet, Erin K, PA-C  ondansetron  (ZOFRAN -ODT) 4 MG disintegrating tablet Take 1 tablet (4 mg total) by mouth every 8 (eight) hours  as needed for up to 12 doses for nausea or vomiting. 03/05/24   Cottie Donnice PARAS, MD  predniSONE  (DELTASONE ) 5 MG tablet Take 5 mg by mouth daily.    [provider]  sertraline  (ZOLOFT ) 50 MG tablet Take 50 mg by mouth daily. 07/10/23   [provider]  sucralfate  (CARAFATE ) 1 GM/10ML suspension Take 10 mLs (1 g total) by mouth 4 (four) times daily -  with meals and at bedtime. Patient not taking: Reported on 07/28/2023 07/27/23   Raspet, Erin K, PA-C  sulfamethoxazole -trimethoprim  (BACTRIM  DS) 800-160 MG tablet Take 1 tablet by mouth 2 (two) times daily. 12/30/24   Christopher Savannah, PA-C    Allergies: Cephalosporins and Heparin     Review of Systems  Constitutional:  Positive for fever.   Vitals:   12/31/24 1722 12/31/24 1722 12/31/24 2041 12/31/24 2152  BP:  (!) 153/82 (!) 147/86   Pulse:  95 90   Resp:  20 18   Temp:  (!) 100.4 F (38 C) (!) 102.9 F (39.4 C) 99.8 F (37.7 C)  TempSrc:   Oral Oral  SpO2:  99% 98%   Weight: 70.8 kg     Height: 4' 9 (1.448 m)       Updated Vital Signs BP (!) 147/86 (BP Location: Right Arm)   Pulse 90   Temp (!) 102.9 F (39.4 C) (Oral)   Resp 18   Ht 4' 9 (1.448 m)   Wt 70.8 kg   LMP 07/23/2016   SpO2 98%   BMI 33.76 kg/m   Physical Exam Vitals and nursing note reviewed.  Constitutional:      General: She is not in acute distress.    Appearance: Normal appearance. She is not ill-appearing, toxic-appearing or diaphoretic.  HENT:     Head: Normocephalic and atraumatic.     Nose: Nose normal.     Mouth/Throat:     Mouth: Mucous membranes are moist.  Eyes:     General: No scleral icterus.       Right eye: No discharge.        Left eye: No discharge.     Extraocular Movements: Extraocular movements intact.     Conjunctiva/sclera: Conjunctivae normal.     Pupils: Pupils are equal, round, and reactive to light.  Cardiovascular:     Rate and Rhythm: Normal rate and regular rhythm.     Pulses: Normal pulses.     Heart  sounds: Normal heart sounds.  Pulmonary:     Effort: Pulmonary effort is normal. No respiratory distress.     Breath sounds: Normal breath sounds. No stridor. No wheezing, rhonchi or rales.  Abdominal:     General: Abdomen is flat. Bowel sounds are normal. There is no distension.     Palpations: Abdomen is soft.     Tenderness: There is no abdominal tenderness. There is no right CVA tenderness, left CVA tenderness or guarding.  Musculoskeletal:        General: Normal range of motion.     Cervical back: Normal range of motion.     Right lower leg: No edema.  Left lower leg: No edema.  Skin:    General: Skin is warm and dry.     Capillary Refill: Capillary refill takes less than 2 seconds.     Coloration: Skin is not jaundiced or pale.     Findings: No bruising, erythema or rash.     Comments: Feels warm to touch  Neurological:     Mental Status: She is alert and oriented to person, place, and time.     (all labs ordered are listed, but only abnormal results are displayed) Labs Reviewed  COMPREHENSIVE METABOLIC PANEL WITH GFR - Abnormal; Notable for the following components:      Result Value   Sodium 129 (*)    Chloride 92 (*)    Glucose, Bld 113 (*)    BUN 21 (*)    Creatinine, Ser 1.43 (*)    GFR, Estimated 45 (*)    All other components within normal limits  CBC WITH DIFFERENTIAL/PLATELET - Abnormal; Notable for the following components:   HCT 35.4 (*)    Neutro Abs 8.6 (*)    Abs Immature Granulocytes 0.09 (*)    All other components within normal limits  URINALYSIS, W/ REFLEX TO CULTURE (INFECTION SUSPECTED) - Abnormal; Notable for the following components:   Protein, ur TRACE (*)    Leukocytes,Ua MODERATE (*)    Bacteria, UA MANY (*)    All other components within normal limits  CULTURE, BLOOD (ROUTINE X 2)  CULTURE, BLOOD (ROUTINE X 2)  URINE CULTURE  LACTIC ACID, PLASMA  PROTIME-INR  LACTIC ACID, PLASMA    EKG: None  Radiology: No results  found.   Procedures   Medications Ordered in the ED  ciprofloxacin  (CIPRO ) IVPB 400 mg (400 mg Intravenous New Bag/Given 12/31/24 2040)  cycloSPORINE  (SANDIMMUNE ) 100 MG/ML solution 90 mg (has no administration in time range)  mycophenolate  (CELLCEPT ) tablet 500 mg (has no administration in time range)  predniSONE  (DELTASONE ) tablet 5 mg (has no administration in time range)  acetaminophen  (TYLENOL ) tablet 650 mg (has no administration in time range)  ondansetron  (ZOFRAN ) injection 4 mg (has no administration in time range)  acetaminophen  (TYLENOL ) tablet 650 mg (has no administration in time range)  sodium chloride  0.9 % bolus 1,000 mL (has no administration in time range)  sodium chloride  0.9 % bolus 1,000 mL (0 mLs Intravenous Stopped 12/31/24 1910)  acetaminophen  (TYLENOL ) tablet 650 mg (650 mg Oral Given 12/31/24 1834)    Patient presents to the ED for concern of fever with known UTI, this involves an extensive number of treatment options, and is a complaint that carries with it a high risk of complications and morbidity.  The differential diagnosis includes sepsis, UTI, pyelonephritis, AKI.   Co morbidities that complicate the patient evaluation  S/p renal transplant Immunosuppression   Additional history obtained:  Additional history obtained from Family and Outside Medical Records   External records from outside source obtained and reviewed including additional history provided by daughter at bedside and review of urgent care visit note from yesterday.   Lab Tests:  I Ordered, and personally interpreted labs.  The pertinent results include:   No leukocytosis and normal lactic acid of 1.1 Hyponatremia of 129 and low chloride of 92 Creatinine of 1.43, BUN 21, and GFR 45, most recent labs to compare are from 10 months ago showing creatinine of 1.18 (baseline appears to be 1.1 to 1.2), BUN 23, and GFR 58 Urine culture from yesterday showing E coli. Susceptibilities  pending. Urinalysis today  showing moderate leukocytes and many bacteria. Blood cultures pending.   Medicines ordered and prescription drug management:  I ordered medication including NS bolus, IV cipro , and tylenol   for fever, UTI.  Reevaluation of the patient after these medicines showed that the patient improved I have reviewed the patients home medicines and have made adjustments as needed   Consultations Obtained:  I requested consultation with the hospitalist Sarah Speaker MD,  and discussed lab and imaging findings as well as pertinent plan - they recommend: Admission to hospital for ongoing treatment and management. Also spoke with [facility] by phone for antibiotic recommendation as the patient is allergic to cephalosporins. Recommend to treat empirically with ciprofloxacin .    Problem List / ED Course:     Cystitis. Urinalysis indicative of infection; urine susceptibilities pending. No physical exam findings or symptoms consistent with pyelonephritis. Normal white count and lactic acid. Blood cultures pending. It is reassuring temperature improved with acetaminophen  and IVF. Considering patient's immunocompromised state (s/p renal transplant) and continued fevering despite beginning antibiotic treatment, recommend hospital admission for ongoing treatment and monitoring. Pharmacy contacted for antibiotic recommendation. Hospitalist accepted patient for admission. AKI. Creatinine elevated from baseline. This will continue to be monitored during admission.   Reevaluation:  After the interventions noted above, I reevaluated the patient and found that they have :improved   Dispostion:  Admission to hospital for ongoing treatment and management of UTI in immunocompromised patient (s/p renal transplant).                                  Medical Decision Making Amount and/or Complexity of Data Reviewed Labs: ordered.  Risk OTC drugs. Prescription drug  management. Decision regarding hospitalization.   This note was produced using Electronics Engineer. While the provider has reviewed and verified all clinical information, transcription errors may remain.    Final diagnoses:  Acute cystitis with hematuria    ED Discharge Orders     None          Rosina Almarie DELENA DEVONNA 12/31/24 2244    Plunkett, Whitney, MD 12/31/24 2305  "

## 2024-12-31 NOTE — ED Triage Notes (Signed)
 Pt reports hx of kidney transplant and dx w/ UTI. Pt is taking abx. Pt reports fever after being at [facility].

## 2025-01-01 ENCOUNTER — Telehealth (HOSPITAL_BASED_OUTPATIENT_CLINIC_OR_DEPARTMENT_OTHER): Payer: Self-pay | Admitting: Emergency Medicine

## 2025-01-01 DIAGNOSIS — A419 Sepsis, unspecified organism: Secondary | ICD-10-CM

## 2025-01-01 DIAGNOSIS — Z940 Kidney transplant status: Secondary | ICD-10-CM

## 2025-01-01 DIAGNOSIS — N3000 Acute cystitis without hematuria: Secondary | ICD-10-CM

## 2025-01-01 DIAGNOSIS — N183 Chronic kidney disease, stage 3 unspecified: Secondary | ICD-10-CM

## 2025-01-01 DIAGNOSIS — E871 Hypo-osmolality and hyponatremia: Secondary | ICD-10-CM

## 2025-01-01 DIAGNOSIS — K219 Gastro-esophageal reflux disease without esophagitis: Secondary | ICD-10-CM | POA: Insufficient documentation

## 2025-01-01 DIAGNOSIS — K2100 Gastro-esophageal reflux disease with esophagitis, without bleeding: Secondary | ICD-10-CM

## 2025-01-01 LAB — BLOOD CULTURE ID PANEL (REFLEXED) - BCID2

## 2025-01-01 LAB — OSMOLALITY, URINE: Osmolality, Ur: 434 mosm/kg (ref 300–900)

## 2025-01-01 LAB — SODIUM, URINE, RANDOM: Sodium, Ur: 107 mmol/L

## 2025-01-01 LAB — URINE CULTURE: Culture: >=100000 — AB

## 2025-01-01 LAB — BASIC METABOLIC PANEL WITH GFR
Anion gap: 13 (ref 5–15)
BUN: 16 mg/dL (ref 6–20)
CO2: 22 mmol/L (ref 22–32)
Calcium: 8.9 mg/dL (ref 8.9–10.3)
Chloride: 97 mmol/L — ABNORMAL LOW (ref 98–111)
Creatinine, Ser: 1.52 mg/dL — ABNORMAL HIGH (ref 0.44–1.00)
GFR, Estimated: 42 mL/min — ABNORMAL LOW
Glucose, Bld: 136 mg/dL — ABNORMAL HIGH (ref 70–99)
Potassium: 3.9 mmol/L (ref 3.5–5.1)
Sodium: 132 mmol/L — ABNORMAL LOW (ref 135–145)

## 2025-01-01 LAB — CREATININE, URINE, RANDOM: Creatinine, Urine: 75 mg/dL

## 2025-01-01 LAB — OSMOLALITY: Osmolality: 281 mosm/kg (ref 275–295)

## 2025-01-01 MED ORDER — LACTATED RINGERS IV SOLN: INTRAVENOUS | Status: AC

## 2025-01-01 MED ORDER — MAGNESIUM OXIDE -MG SUPPLEMENT 400 (240 MG) MG PO TABS
400.0000 mg | ORAL_TABLET | Freq: Every day | ORAL | Status: DC
  Administered 2025-01-01 – 2025-01-07 (×7): 400 mg via ORAL
  Filled 2025-01-01 (×7): qty 1

## 2025-01-01 MED ORDER — PIPERACILLIN-TAZOBACTAM 3.375 G IVPB
3.3750 g | Freq: Three times a day (TID) | INTRAVENOUS | Status: DC
  Administered 2025-01-01 – 2025-01-05 (×12): 3.375 g via INTRAVENOUS
  Filled 2025-01-01 (×12): qty 50

## 2025-01-01 MED ORDER — MYCOPHENOLATE MOFETIL 250 MG PO CAPS
500.0000 mg | ORAL_CAPSULE | Freq: Three times a day (TID) | ORAL | Status: DC
  Administered 2025-01-01 (×3): 500 mg via ORAL
  Filled 2025-01-01 (×5): qty 2

## 2025-01-01 MED ORDER — ENOXAPARIN SODIUM 30 MG/0.3ML IJ SOSY
30.0000 mg | PREFILLED_SYRINGE | INTRAMUSCULAR | Status: DC
  Administered 2025-01-01: 30 mg via SUBCUTANEOUS
  Filled 2025-01-01: qty 0.3

## 2025-01-01 MED ORDER — SERTRALINE HCL 50 MG PO TABS
50.0000 mg | ORAL_TABLET | Freq: Every day | ORAL | Status: DC
  Administered 2025-01-01 – 2025-01-07 (×7): 50 mg via ORAL
  Filled 2025-01-01: qty 2
  Filled 2025-01-01 (×6): qty 1

## 2025-01-01 MED ORDER — MYCOPHENOLATE MOFETIL 500 MG PO TABS: 500.0000 mg | ORAL_TABLET | Freq: Two times a day (BID) | ORAL | Status: DC

## 2025-01-01 MED ORDER — CIPROFLOXACIN IN D5W 400 MG/200ML IV SOLN: 400.0000 mg | Freq: Two times a day (BID) | INTRAVENOUS | Status: DC

## 2025-01-01 MED ORDER — PIPERACILLIN-TAZOBACTAM 3.375 G IVPB 30 MIN
3.3750 g | Freq: Once | INTRAVENOUS | Status: AC
  Administered 2025-01-01: 3.375 g via INTRAVENOUS
  Filled 2025-01-01: qty 50

## 2025-01-01 NOTE — ED Notes (Signed)
 Attempted to call report to [address] at [facility], no answer.

## 2025-01-01 NOTE — ED Notes (Addendum)
 No admission bed available per Franne Naomi PEAK. Notified Vernell Meier, [facility] to courier medications. She is starting the process.

## 2025-01-01 NOTE — TOC Initial Note (Signed)
 Transition of Care [facility]) - Initial/Assessment Note    Patient Details  Name: Sarah Dyer Dyer MRN: [id number] Date of Birth: 1976-06-11  [facility]) CM/SW Contact:    Bascom Service, RN Phone Number: 01/01/2025, 3:14 PM  Clinical Narrative:d/c home. Has own transport home.                     Barriers to Discharge: Continued Medical Work up   Patient Goals and CMS Choice Patient states their goals for this hospitalization and ongoing recovery are:: Home CMS Medicare.gov Compare Post Acute Care list provided to:: Patient Choice offered to / list presented to : Patient      Expected Discharge Plan and Services                                              Prior Living Arrangements/Services                       Activities of Daily Living      Permission Sought/Granted                  Emotional Assessment              Admission diagnosis:  Acute cystitis [N30.00] Acute cystitis with hematuria [N30.01] Patient Active Problem List   Diagnosis Date Noted   Sepsis (HCC) 01/01/2025   GERD (gastroesophageal reflux disease) 01/01/2025   Chronic kidney disease (CKD), stage III (moderate) (HCC) 01/01/2025   Hyponatremia 01/01/2025   Acute cystitis 12/31/2024   Elevated transaminase level 07/28/2023   HLD (hyperlipidemia) 07/28/2023   Immunosuppression 07/24/2016   SIRS (systemic inflammatory response syndrome) (HCC) 07/23/2016   Epigastric pain 07/31/2013   Abnormal ECG 07/31/2013   Abdominal pain 07/24/2013   Nausea & vomiting 07/24/2013   Gastroenteritis 07/24/2013   Renal transplant, status post 07/24/2013   Renal failure 01/31/2007   PCP:  Waylan Almarie SAUNDERS, MD Pharmacy:   [facility] [address] [place] - [address] [place] [address] Phone: [phone] Fax: [phone]  [facility] [place], [place] - [address] [place]  [address] Phone: [phone] Fax: [phone]  [id number] - [place], [place] - [address] AT [facility] [address] [place] [address] Phone: [phone] Fax: [phone]     Social Drivers of Health (SDOH) Social History: SDOH Screenings   Food Insecurity: No Food Insecurity (07/29/2023)  Housing: Patient Declined (07/29/2023)  Transportation Needs: No Transportation Needs (07/29/2023)  Utilities: Patient Unable To Answer (07/29/2023)  Tobacco Use: Low Risk (12/31/2024)   SDOH Interventions:     Readmission Risk Interventions     No data to display

## 2025-01-01 NOTE — Assessment & Plan Note (Signed)
 Baseline creatinine 1.2-1.4 with GFR in the 40s Creatinine 1.4 today Appears near baseline Minimize nephrotoxic agents in the setting of renal transplant IV fluid hydration in the setting of concurrent sepsis Follow closely

## 2025-01-01 NOTE — H&P (Addendum)
 " [facility] History and Physical   Referring Provider: Almarie Potter PA  Telemedicine Provider: Eldonna MD  Provider Location: [facility]   Patient Location:  [facility]  Referring Diagnosis: sepsis, UTI  Patient Name and DOB verified: yes  Patient consented to Telemedicine Evaluation:yes  RN virtual assistant: Alan Flora RN  Video encounter time and date:01/01/25 0800  Additional Information:  Transfusion: no  CPAP/BIPAP: no  O2:no  Foley: no   Patient: Sarah Dyer FMW:[id number] DOB: October 19, 1976 DOA: 12/31/2024 DOS: the patient was seen and examined on 01/01/2025 PCP: Waylan Almarie SAUNDERS, MD  Patient coming from: Home  Chief Complaint:  Chief Complaint  Patient presents with   Urinary Tract Infection   Fever   HPI: Sarah Dyer is a 49 y.o. female with medical history significant of ESRD status post renal transplant in [place] in 2009 on chronic immunosuppressive medication, stage III CKD GERD, hyperlipidemia presenting with sepsis, cystitis, hyponatremia.  Patient reports having dysuria and increased urinary frequency over multiple days.  Was originally seen in the ED on January 27.  Diagnosed with UTI at bedtime.  Given course of Bactrim  for treatment.  Had urine culture obtained that initially showed E. coli.  Sensitivities pending. Per the patient, she had some mild improvement in symptoms over 2 to 3 days though symptoms acutely worsen over the past 24 hours.  Positive nausea and decreased p.o. intake.  No belly pain or diarrhea.  No chest pain or shortness of breath.  Has been compliant with antirejection regimen since initiation in 2009.  No reported alcohol or tobacco use.  Dysuria and increased urinary frequency have persisted.  No back pain or flank pain at present. Presented to the ER Tmax of 102.9.  Heart rate 60s to 70s, respirations 20s to 30s, blood pressure 130s to 150s over 80s to 90s.  Satting well on room air.  White count 7.5, hemoglobin  12.4, platelets 300.  Lactate within normal limits x 2.  Creatinine 1.43 with GFR in the 40s.  Sodium 129.  Urinalysis indicative of infection. Review of Systems: As mentioned in the history of present illness. All other systems reviewed and are negative. Past Medical History:  Diagnosis Date   Chronic kidney disease    Gastritis    Past Surgical History:  Procedure Laterality Date   CHOLECYSTECTOMY     KIDNEY TRANSPLANT  January2009   TUBAL LIGATION     Social History:  reports that she has never smoked. She has never used smokeless tobacco. She reports that she does not drink alcohol and does not use drugs.  Allergies[1]  History reviewed. No pertinent family history.  Prior to Admission medications  Medication Sig Start Date End Date Taking? Authorizing Provider  acetaminophen  (TYLENOL ) 500 MG tablet Take 500 mg by mouth every 6 (six) hours as needed for pain.    [provider]  benzonatate  (TESSALON ) 100 MG capsule Take 1 capsule (100 mg total) by mouth every 8 (eight) hours. 11/16/24   Mayer, Jodi R, NP  cycloSPORINE  (SANDIMMUNE ) 100 MG/ML solution Take 90 mg by mouth 2 (two) times daily.    [provider]  famotidine  (PEPCID ) 40 MG tablet Take 40 mg by mouth daily. 07/10/23   [provider]  magnesium  oxide (MAG-OX) 400 MG tablet Take 400 mg by mouth daily. 10/21/19   [provider]  metoCLOPramide  (REGLAN ) 10 MG tablet Take 1 tablet (10 mg total) by mouth every 8 (eight) hours as needed for up to 10  doses for nausea. 03/05/24   Cottie Donnice PARAS, MD  mycophenolate  (CELLCEPT ) 500 MG tablet Take 500 mg by mouth 3 (three) times daily.    [provider]  ondansetron  (ZOFRAN -ODT) 4 MG disintegrating tablet Take 1 tablet (4 mg total) by mouth every 8 (eight) hours as needed for nausea or vomiting. Patient not taking: Reported on 07/28/2023 07/27/23   Raspet, Erin K, PA-C  ondansetron  (ZOFRAN -ODT) 4 MG disintegrating tablet Take 1 tablet (4 mg  total) by mouth every 8 (eight) hours as needed for up to 12 doses for nausea or vomiting. 03/05/24   Cottie Donnice PARAS, MD  predniSONE  (DELTASONE ) 5 MG tablet Take 5 mg by mouth daily.    [provider]  sertraline  (ZOLOFT ) 50 MG tablet Take 50 mg by mouth daily. 07/10/23   [provider]  sucralfate  (CARAFATE ) 1 GM/10ML suspension Take 10 mLs (1 g total) by mouth 4 (four) times daily -  with meals and at bedtime. Patient not taking: Reported on 07/28/2023 07/27/23   Raspet, Erin K, PA-C  sulfamethoxazole -trimethoprim  (BACTRIM  DS) 800-160 MG tablet Take 1 tablet by mouth 2 (two) times daily. 12/30/24   Christopher Savannah, PA-C    Physical Exam: Vitals:   01/01/25 0800 01/01/25 0835 01/01/25 0900 01/01/25 0912  BP: (!) 162/79  (!) 140/71   Pulse: 97  85   Resp: (!) 32  (!) 33   Temp:  (!) 102.9 F (39.4 C)  (!) 102.6 F (39.2 C)  TempSrc:  Oral  Oral  SpO2: 98%  97%   Weight:      Height:       Physical Exam Vitals reviewed. Nursing note reviewed: Portions of the physical exam including cardiac, pulmonary, abdominal, scan performed by Alan Just, RN. Constitutional:      Appearance: She is obese.  HENT:     Head: Normocephalic and atraumatic.     Mouth/Throat:     Mouth: Mucous membranes are moist.  Eyes:     Pupils: Pupils are equal, round, and reactive to light.  Cardiovascular:     Rate and Rhythm: Normal rate and regular rhythm.  Pulmonary:     Effort: Pulmonary effort is normal.  Abdominal:     General: Bowel sounds are normal.  Musculoskeletal:        General: Normal range of motion.  Skin:    General: Skin is warm.  Neurological:     General: No focal deficit present.  Psychiatric:        Mood and Affect: Mood normal.     Data Reviewed:  There are no new results to review at this time.  MM 3D SCREENING MAMMOGRAM BILATERAL BREAST CLINICAL DATA:  Screening.  EXAM: DIGITAL SCREENING BILATERAL MAMMOGRAM WITH TOMOSYNTHESIS AND  CAD  TECHNIQUE: Bilateral screening digital craniocaudal and mediolateral oblique mammograms were obtained. Bilateral screening digital breast tomosynthesis was performed. The images were evaluated with computer-aided detection.  COMPARISON:  Previous exam(s).  ACR Breast Density Category c: The breasts are heterogeneously dense, which may obscure small masses.  FINDINGS: There are no findings suspicious for malignancy.  IMPRESSION: No mammographic evidence of malignancy. A result letter of this screening mammogram will be mailed directly to the patient.  RECOMMENDATION: Screening mammogram in one year. (Code:SM-B-01Y)  BI-RADS CATEGORY  1: Negative.  Electronically Signed   By: Inocente Ast M.D.   On: 07/18/2024 09:12  Lab Results  Component Value Date   WBC 10.5 12/31/2024   HGB 12.4 12/31/2024  HCT 35.4 (L) 12/31/2024   MCV 81.2 12/31/2024   PLT 300 12/31/2024   Last metabolic panel Lab Results  Component Value Date   GLUCOSE 113 (H) 12/31/2024   NA 129 (L) 12/31/2024   K 4.1 12/31/2024   CL 92 (L) 12/31/2024   CO2 25 12/31/2024   BUN 21 (H) 12/31/2024   CREATININE 1.43 (H) 12/31/2024   GFRNONAA 45 (L) 12/31/2024   CALCIUM 9.5 12/31/2024   PROT 7.5 12/31/2024   ALBUMIN 4.5 12/31/2024   BILITOT 0.9 12/31/2024   ALKPHOS 102 12/31/2024   AST 34 12/31/2024   ALT 27 12/31/2024   ANIONGAP 13 12/31/2024    Assessment and Plan: * Acute cystitis + dysuria, increased urinary frequency with fever and failed outpatient course of antibiotics UA indicative of infection  Noted 12/30/24 Urine culture w/ E coli (sensitivities pending)  Chronic immunosuppression is a confounding issue  Started on IV cipro  in the ER in setting of severe PCN allergy Will continue  Repeat urine culture  Monitor     Sepsis (HCC) Meeting sepsis criteria w/ T 102, HR 20s-30s  WBC 10.5  Noted active cystitis- failed outpatient regimen  Lactate WNL  On zosyn  for urinary  coverage  Panculture  LR MIVF      Hyponatremia Sodium 129 on presentation Noted current sepsis associated with UTI Mildly dry to euvolemic on presentation IV fluid hydration Trend sodium Goal 6-8 mill equivalents changed over the next 12 to 24 hours  Chronic kidney disease (CKD), stage III (moderate) (HCC) Baseline creatinine 1.2-1.4 with GFR in the 40s Creatinine 1.4 today Appears near baseline Minimize nephrotoxic agents in the setting of renal transplant IV fluid hydration in the setting of concurrent sepsis Follow closely  GERD (gastroesophageal reflux disease) On pepcid  and carafate    Renal transplant, status post Baseline history of ESRD s/p renal cell transplant 2009 in [place]  On immunosuppressant regimen including cyclosporine , cellcept , prednisone   Cont regimen  Titrate regimen w/ nephrology input as appropriate  Follow        Advance Care Planning:   Code Status: Prior   Consults: none   Family Communication: Husband at the bedside   Severity of Illness: The appropriate patient status for this patient is OBSERVATION. Observation status is judged to be reasonable and necessary in order to provide the required intensity of service to ensure the patient's safety. The patient's presenting symptoms, physical exam findings, and initial radiographic and laboratory data in the context of their medical condition is felt to place them at decreased risk for further clinical deterioration. Furthermore, it is anticipated that the patient will be medically stable for discharge from the hospital within 2 midnights of admission.   Author: Elspeth JINNY Masters, MD 01/01/2025 9:30 AM  For on call review [email or web address].      [1]  Allergies Allergen Reactions   Cephalosporins Anaphylaxis   Heparin  Itching    Pt reports she had an injection in the hospital 2 weeks ago and she was itching all over.    "

## 2025-01-01 NOTE — ED Notes (Addendum)
 Sarah Dyer, RPH reports that the dosage and concentration or the cyclosporine  in not available at main facility. Patient called her husband to bring medication and was able to get in touch with him.

## 2025-01-01 NOTE — ED Notes (Signed)
 [facility] asked for the charge nurse investigate if the patient will be assigned a bed soon. If not, they will courier patients anti-rejection medications. Franne, charge RN calling now.

## 2025-01-01 NOTE — Assessment & Plan Note (Addendum)
 Meeting sepsis criteria w/ T 102, HR 20s-30s  WBC 10.5  Noted active cystitis- failed outpatient regimen  Lactate WNL  On zosyn  for urinary coverage  Panculture  LR MIVF

## 2025-01-01 NOTE — Plan of Care (Incomplete)
" °  Problem: Education: Goal: Knowledge of General Education information will improve Description: Including pain rating scale, medication(s)/side effects and non-pharmacologic comfort measures Outcome: Progressing   Problem: Health Behavior/Discharge Planning: Goal: Ability to manage health-related needs will improve Outcome: Progressing   Problem: Clinical Measurements: Goal: Ability to maintain clinical measurements within normal limits will improve Outcome: Progressing Goal: Will remain free from infection Outcome: Progressing Goal: Diagnostic test results will improve Outcome: Progressing   Problem: Clinical Measurements: Goal: Respiratory complications will improve Outcome: Adequate for Discharge Goal: Cardiovascular complication will be avoided Outcome: Adequate for Discharge   "

## 2025-01-01 NOTE — ED Notes (Signed)
 Assisted with virtual hospitalist visit with Dr. Eldonna.

## 2025-01-01 NOTE — ED Notes (Signed)
 [facility] has been called to transport the patient to [facility] 4E rm# [id number]

## 2025-01-01 NOTE — Assessment & Plan Note (Addendum)
 On pepcid  and carafate 

## 2025-01-01 NOTE — Assessment & Plan Note (Signed)
+   dysuria, increased urinary frequency with fever and failed outpatient course of antibiotics UA indicative of infection  Noted 12/30/24 Urine culture w/ E coli (sensitivities pending)  Chronic immunosuppression is a confounding issue  Started on IV cipro  in the ER in setting of severe PCN allergy Will continue  Repeat urine culture  Monitor

## 2025-01-01 NOTE — ED Notes (Signed)
 Called bed placement, spoke with margo (sp) [id number] time for pt transfer. Pt has medications r/t transplant that were not administered last night d/t availability at the [employer]. At the time of conversation, there is no avail est. Time for TF, was recommended that medication be couriered. EDP Lemly notified and was in agreement with this plan

## 2025-01-01 NOTE — Assessment & Plan Note (Addendum)
 Baseline history of ESRD s/p renal cell transplant 2009 in [place]  On immunosuppressant regimen including cyclosporine , cellcept , prednisone   Cont regimen  Titrate regimen w/ nephrology input as appropriate  Follow

## 2025-01-01 NOTE — ED Notes (Signed)
 Called report to [place], [place] for [phone].

## 2025-01-01 NOTE — Progress Notes (Signed)
 Urine culture January 27 showing E. coli resistant to Bactrim  as well as Cipro  amongst others Transitioned to Zosyn  for infectious coverage pending repeat urine culture

## 2025-01-01 NOTE — Assessment & Plan Note (Signed)
 Sodium 129 on presentation Noted current sepsis associated with UTI Mildly dry to euvolemic on presentation IV fluid hydration Trend sodium Goal 6-8 mill equivalents changed over the next 12 to 24 hours

## 2025-01-01 NOTE — ED Notes (Signed)
 Acetaminophen  administered for fever. Patient awake and alert. Ambulated to restroom. After speaking with patient, she was not given her cyclosporine  or Cellcept  overnight. I will arrange with courier to bring medications from [facility] or [facility].

## 2025-01-01 NOTE — Progress Notes (Addendum)
 Evaluated the patient upon arrival to [facility] and reviewed the chart.   Subjective: She has a headache. No appetite.   Today's Vitals   01/01/25 1514 01/01/25 1532 01/01/25 1641 01/01/25 1704  BP: 130/79  (!) 152/81   Pulse:   85   Resp:   (!) 25   Temp:   100.3 F (37.9 C)   TempSrc:   Oral   SpO2:   100%   Weight:      Height:      PainSc:  0-No pain 5  3    Body mass index is 33.76 kg/m.  General exam: Appears comfortable  HEENT: oral mucosa moist Respiratory system: Clear to auscultation.  Cardiovascular system: S1 & S2 heard  Gastrointestinal system: Abdomen soft, non-tender, nondistended. Normal bowel sounds   Extremities: No cyanosis, clubbing or edema Psychiatry:  Mood & affect appropriate.   Sepsis due to E coli Cystitis and bacteremia in immune compromised patient  - one set of blood cultures reveals gram neg rods and one showing both gram neg and gram + rods - BCID reveals E coli as dose the urine culture from 1/27 - U culture shows that e coli is resistant to the Bactrim  that she was prescribed - She was started on Zosyn  - this can be continued  Renal transplant - have spoken with nephrology, Josh Larrazola- recommended to continue immunosuppression at this time - their team will evaluate the patient  AKI - Cr 1.52- baseline is about 1.18 - likely secondary to sepsis  True Atlas, MD

## 2025-01-01 NOTE — ED Notes (Signed)
 Attempted to call report, 4E US  gave direct number to call in 5 minutes for report.

## 2025-01-01 NOTE — Treatment Plan (Signed)
 Nephrology Treatment Plan  Consultant: Earley Saucer MD  Question: Does immunosuppression need to be held for [facility] Septicemia?  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    Immunosuppression consists of prednisone  5 daily mycophenolate  500 3 times daily though recently reduced to twice daily for BK bacteremia, cyclosporine  90 mg twice daily.   Patient initially presented to the ED on January 27th was diagnosed with a UTI given a course of Bactrim  discharged though E. coli was found to be resistant.  Initially patient felt better though symptoms worse and the day before presentation. patient returned with cystitis found to be septic secondary to E. coli bacteremia. Patient currently with a creatinine of 1.52 which is near her baseline, potassium within normal limits.   1 set of blood culture showing both gram-negative and gram-positive rods. Currently treated with IV Zosyn .   Plan:  Given the patient is currently not on vasopressors and critically ill it is okay to continue immunosuppression.  Would confirm with patient that she was told to restart MMF 3 times daily versus twice daily as instructed in clinic note on 12/22/2024.  Formal consult: Tomorrow  Stephana Morell MD  [facility]

## 2025-01-02 ENCOUNTER — Ambulatory Visit: Payer: Self-pay | Admitting: Urgent Care

## 2025-01-02 DIAGNOSIS — A4151 Sepsis due to Escherichia coli [E. coli]: Secondary | ICD-10-CM | POA: Diagnosis not present

## 2025-01-02 DIAGNOSIS — R6520 Severe sepsis without septic shock: Secondary | ICD-10-CM

## 2025-01-02 DIAGNOSIS — N179 Acute kidney failure, unspecified: Secondary | ICD-10-CM

## 2025-01-02 LAB — COMPREHENSIVE METABOLIC PANEL WITH GFR
ALT: 36 U/L (ref 0–44)
AST: 40 U/L (ref 15–41)
Albumin: 3.5 g/dL (ref 3.5–5.0)
Alkaline Phosphatase: 112 U/L (ref 38–126)
Anion gap: 10 (ref 5–15)
BUN: 12 mg/dL (ref 6–20)
CO2: 23 mmol/L (ref 22–32)
Calcium: 8.9 mg/dL (ref 8.9–10.3)
Chloride: 96 mmol/L — ABNORMAL LOW (ref 98–111)
Creatinine, Ser: 1.41 mg/dL — ABNORMAL HIGH (ref 0.44–1.00)
GFR, Estimated: 46 mL/min — ABNORMAL LOW
Glucose, Bld: 116 mg/dL — ABNORMAL HIGH (ref 70–99)
Potassium: 4.4 mmol/L (ref 3.5–5.1)
Sodium: 129 mmol/L — ABNORMAL LOW (ref 135–145)
Total Bilirubin: 1 mg/dL (ref 0.0–1.2)
Total Protein: 6.3 g/dL — ABNORMAL LOW (ref 6.5–8.1)

## 2025-01-02 LAB — CBC
HCT: 33.9 % — ABNORMAL LOW (ref 36.0–46.0)
Hemoglobin: 11.6 g/dL — ABNORMAL LOW (ref 12.0–15.0)
MCH: 28.9 pg (ref 26.0–34.0)
MCHC: 34.2 g/dL (ref 30.0–36.0)
MCV: 84.3 fL (ref 80.0–100.0)
Platelets: 240 10*3/uL (ref 150–400)
RBC: 4.02 MIL/uL (ref 3.87–5.11)
RDW: 13.9 % (ref 11.5–15.5)
WBC: 16.6 10*3/uL — ABNORMAL HIGH (ref 4.0–10.5)
nRBC: 0 % (ref 0.0–0.2)

## 2025-01-02 LAB — URINE CULTURE: Culture: >=100000 — AB

## 2025-01-02 MED ORDER — ENOXAPARIN SODIUM 40 MG/0.4ML IJ SOSY
40.0000 mg | PREFILLED_SYRINGE | INTRAMUSCULAR | Status: DC
  Administered 2025-01-02 – 2025-01-06 (×5): 40 mg via SUBCUTANEOUS
  Filled 2025-01-02 (×5): qty 0.4

## 2025-01-02 MED ORDER — ACETAMINOPHEN 325 MG PO TABS
650.0000 mg | ORAL_TABLET | ORAL | Status: DC | PRN
  Administered 2025-01-02 – 2025-01-03 (×4): 650 mg via ORAL
  Filled 2025-01-02 (×4): qty 2

## 2025-01-02 NOTE — Consult Note (Signed)
 Reason for Consult: Transplant Referring Physician:  Dr. Earley  Chief Complaint: fevers  Assessment/Plan: Renal transplant with CKDIIIa close to her baseline renal function - would hold the Cellcept  for 3-5 days as Cellcept  does increase risk of bacterial infection. Let's help her clear the infection and can restart the Cellcept  at twice a day dosing. Continue the CSA and Prednisone ; prednisone  will help with inflammatory response as well. Will also check a CSA trough later today. Sepsis secondary to E.Coli systitis on Bactrim  (was resistant to Bactrim ) and will monitor clinical response. AKI on CKD IIIa not far off baseline likely secondary prerenal azotemia with poor oral intake associated with the UTI. No e/o rejection and no tenderness over transplant site. Hypertension - monitor for now GERD Headaches likely secondary to UTI   HPI: Sarah Dyer Dyer is an 49 y.o. 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 Patient continued to have fever, chills, diaphoresis especially at night. She has also had headaches, nausea but has been able to keep her meds down. She denies any NSAID use and only uses Tylenol  for pain. Patient currently being treated with Zosyn .   ROS Pertinent items are noted in HPI.  Chemistry and CBC: Creatinine, Ser  Date/Time Value Ref Range Status  01/01/2025 10:55 AM 1.52 (H) 0.44 - 1.00 mg/dL Final  98/71/7973 94:74 PM 1.43 (H) 0.44 - 1.00 mg/dL Final  95/97/7974 90:98 PM 1.18 (H) 0.44 - 1.00 mg/dL Final  91/74/7975 94:59 AM 1.29 (H) 0.44 - 1.00 mg/dL Final  91/75/7975 88:66 AM 1.10 (H) 0.44 - 1.00 mg/dL Final  91/76/7975 89:96 PM 1.43 (H) 0.44 - 1.00 mg/dL Final  91/76/7975 95:55 PM 1.49 (H) 0.44 - 1.00 mg/dL Final  95/80/7975 93:97 PM 1.20 (H) 0.44 - 1.00  mg/dL Final  94/78/7980 92:52 PM 1.00 0.44 - 1.00 mg/dL Final  91/78/7982 94:60 AM 1.02 (H) 0.44 - 1.00 mg/dL Final  91/79/7982 91:84 AM 1.26 (H) 0.44 - 1.00 mg/dL Final  91/70/7985 94:97 AM 1.08 0.50 - 1.10 mg/dL Final  91/71/7985 95:99 AM 0.93 0.50 - 1.10 mg/dL Final  91/72/7985 89:69 PM 1.05 0.50 - 1.10 mg/dL Final  91/77/7985 95:49 AM 1.04 0.50 - 1.10 mg/dL Final  91/78/7985 89:54 AM 1.09 0.50 - 1.10 mg/dL Final  91/79/7985 88:54 PM 1.00 0.50 - 1.10 mg/dL Final  89/90/7987 90:89 PM 1.08 0.50 - 1.10 mg/dL Final   Recent Labs  Lab 12/31/24 1725 01/01/25 1055  NA 129* 132*  K 4.1 3.9  CL 92* 97*  CO2 25 22  GLUCOSE 113* 136*  BUN 21* 16  CREATININE 1.43* 1.52*  CALCIUM 9.5 8.9   Recent Labs  Lab 12/31/24 1725 01/02/25 0641  WBC 10.5 16.6*  NEUTROABS 8.6*  --   HGB 12.4 11.6*  HCT 35.4* 33.9*  MCV 81.2 84.3  PLT 300 240   Liver Function Tests: Recent Labs  Lab 12/31/24 1725  AST 34  ALT 27  ALKPHOS 102  BILITOT 0.9  PROT 7.5  ALBUMIN 4.5   No results for input(s): LIPASE, AMYLASE in the last 168 hours. No results for input(s): AMMONIA in the last 168 hours. Cardiac Enzymes: No results for input(s): CKTOTAL, CKMB, CKMBINDEX, TROPONINI in the last 168 hours. Iron Studies: No results for input(s): IRON, TIBC, TRANSFERRIN, FERRITIN in the last 72 hours. PT/INR: @LABRCNTIP (inr:5)  Xrays/Other  Studies: ) Results for orders placed or performed during the hospital encounter of 12/31/24 (from the past 48 hours)  Comprehensive metabolic panel     Status: Abnormal   Collection Time: 12/31/24  5:25 PM  Result Value Ref Range   Sodium 129 (L) 135 - 145 mmol/L   Potassium 4.1 3.5 - 5.1 mmol/L   Chloride 92 (L) 98 - 111 mmol/L   CO2 25 22 - 32 mmol/L   Glucose, Bld 113 (H) 70 - 99 mg/dL    Comment: Glucose reference range applies only to samples taken after fasting for at least 8 hours.   BUN 21 (H) 6 - 20 mg/dL   Creatinine, Ser 8.56 (H)  0.44 - 1.00 mg/dL   Calcium 9.5 8.9 - 89.6 mg/dL   Total Protein 7.5 6.5 - 8.1 g/dL   Albumin 4.5 3.5 - 5.0 g/dL   AST 34 15 - 41 U/L   ALT 27 0 - 44 U/L   Alkaline Phosphatase 102 38 - 126 U/L   Total Bilirubin 0.9 0.0 - 1.2 mg/dL   GFR, Estimated 45 (L) >60 mL/min    Comment: (NOTE) Calculated using the CKD-EPI Creatinine Equation (2021)    Anion gap 13 5 - 15    Comment: Performed at [facility], [address], [place], [place] [address]  Lactic acid, plasma     Status: None   Collection Time: 12/31/24  5:25 PM  Result Value Ref Range   Lactic Acid, Venous 1.1 0.5 - 1.9 mmol/L    Comment: Performed at [facility], [address], [place], [place] [address]  CBC with Differential     Status: Abnormal   Collection Time: 12/31/24  5:25 PM  Result Value Ref Range   WBC 10.5 4.0 - 10.5 K/uL   RBC 4.36 3.87 - 5.11 MIL/uL   Hemoglobin 12.4 12.0 - 15.0 g/dL   HCT 64.5 (L) 63.9 - 53.9 %   MCV 81.2 80.0 - 100.0 fL   MCH 28.4 26.0 - 34.0 pg   MCHC 35.0 30.0 - 36.0 g/dL   RDW 86.4 88.4 - 84.4 %   Platelets 300 150 - 400 K/uL   nRBC 0.0 0.0 - 0.2 %   Neutrophils Relative % 81 %   Neutro Abs 8.6 (H) 1.7 - 7.7 K/uL   Lymphocytes Relative 9 %   Lymphs Abs 0.9 0.7 - 4.0 K/uL   Monocytes Relative 8 %   Monocytes Absolute 0.8 0.1 - 1.0 K/uL   Eosinophils Relative 0 %   Eosinophils Absolute 0.0 0.0 - 0.5 K/uL   Basophils Relative 1 %   Basophils Absolute 0.1 0.0 - 0.1 K/uL   Immature Granulocytes 1 %   Abs Immature Granulocytes 0.09 (H) 0.00 - 0.07 K/uL    Comment: Performed at [facility], [address], [place], [place] [address]  Protime-INR     Status: None   Collection Time: 12/31/24  5:25 PM  Result Value Ref Range   Prothrombin Time 13.2 11.4 - 15.2 seconds   INR 1.0 0.8 - 1.2    Comment: (NOTE) INR goal varies based on device and disease states. Performed at [facility], [address] [place], [place] [address]   Culture, blood (Routine x 2)     Status: None (Preliminary result)   Collection Time: 12/31/24  5:25 PM   Specimen: BLOOD  Result Value Ref Range   Specimen Description      BLOOD RIGHT ANTECUBITAL  Performed at [facility], [address] [place], [place] [address]    Special Requests      Blood Culture results may not be optimal due to an inadequate volume of blood received in culture bottles BOTTLES DRAWN AEROBIC AND ANAEROBIC Performed at [facility], [address] [place], [place] [address]    Culture  Setup Time      GRAM POSITIVE RODS ANAEROBIC BOTTLE ONLY CRITICAL RESULT CALLED TO, READ BACK BY AND VERIFIED WITH: C. MAS RN, AT 9090 01/01/25 D. JEARLEAN SETTLE NEGATIVE RODS AEROBIC BOTTLE ONLY CRITICAL RESULT CALLED TO, READ BACK BY AND VERIFIED WITH: RN J.SHEEHAN AT 1053 ON 01/01/2025 BY T.SAAD. Performed at [facility], [address], [place], [place] [address]    Culture GRAM POSITIVE RODS GRAM NEGATIVE RODS     Report Status PENDING   Urinalysis, w/ Reflex to Culture (Infection Suspected) -Urine, Clean Catch     Status: Abnormal   Collection Time: 12/31/24  5:25 PM  Result Value Ref Range   Specimen Source URINE, CATHETERIZED    Color, Urine YELLOW YELLOW   APPearance CLEAR CLEAR   Specific Gravity, Urine 1.014 1.005 - 1.030   pH 7.0 5.0 - 8.0   Glucose, UA NEGATIVE NEGATIVE mg/dL   Hgb urine dipstick NEGATIVE NEGATIVE   Bilirubin Urine NEGATIVE NEGATIVE   Ketones, ur NEGATIVE NEGATIVE mg/dL   Protein, ur TRACE (A) NEGATIVE mg/dL   Nitrite NEGATIVE NEGATIVE   Leukocytes,Ua MODERATE (A) NEGATIVE   RBC / HPF 0-5 0 - 5 RBC/hpf   WBC, UA 21-50 0 - 5 WBC/hpf    Comment:        Reflex urine culture not performed if WBC <=10, OR if Squamous epithelial cells >5. If Squamous epithelial cells >5 suggest recollection.    Bacteria, UA MANY (A) NONE SEEN   Squamous Epithelial / HPF 0-5 0  - 5 /HPF    Comment: Performed at [facility], [address], [place], [place] [address]  Urine Culture     Status: Abnormal   Collection Time: 12/31/24  5:25 PM   Specimen: Urine, Random  Result Value Ref Range   Specimen Description      URINE, RANDOM Performed at [facility], [address] [place], [place] [address]    Special Requests      NONE Reflexed from [id number] Performed at [facility], [address] [place], [place] [address]    Culture >=100,000 COLONIES/mL ESCHERICHIA COLI (A)    Report Status 01/02/2025 FINAL    Organism ID, Bacteria ESCHERICHIA COLI (A)       Susceptibility   Escherichia coli - MIC*    AMPICILLIN  >=32 RESISTANT Resistant     CEFAZOLIN (URINE) Value in next row Sensitive      4 SENSITIVEThis is a modified FDA-approved test that has been validated and its performance characteristics determined by the reporting laboratory.  This laboratory is certified under the Clinical Laboratory Improvement Amendments CLIA as qualified to perform high complexity clinical laboratory testing.    CEFEPIME Value in next row Sensitive      4 SENSITIVEThis is a modified FDA-approved test that has been validated and its performance characteristics determined by the reporting laboratory.  This laboratory is certified under the Clinical Laboratory Improvement Amendments CLIA as qualified to perform high complexity clinical laboratory testing.    ERTAPENEM  Value in next row Sensitive      4 SENSITIVEThis is a modified FDA-approved test  that has been validated and its performance characteristics determined by the reporting laboratory.  This laboratory is certified under the Clinical Laboratory Improvement Amendments CLIA as qualified to perform high complexity clinical laboratory testing.    CEFTRIAXONE Value in next row Sensitive      4 SENSITIVEThis is a modified FDA-approved test that has been validated and its  performance characteristics determined by the reporting laboratory.  This laboratory is certified under the Clinical Laboratory Improvement Amendments CLIA as qualified to perform high complexity clinical laboratory testing.    CIPROFLOXACIN  Value in next row Resistant      4 SENSITIVEThis is a modified FDA-approved test that has been validated and its performance characteristics determined by the reporting laboratory.  This laboratory is certified under the Clinical Laboratory Improvement Amendments CLIA as qualified to perform high complexity clinical laboratory testing.    GENTAMICIN Value in next row Sensitive      4 SENSITIVEThis is a modified FDA-approved test that has been validated and its performance characteristics determined by the reporting laboratory.  This laboratory is certified under the Clinical Laboratory Improvement Amendments CLIA as qualified to perform high complexity clinical laboratory testing.    NITROFURANTOIN Value in next row Sensitive      4 SENSITIVEThis is a modified FDA-approved test that has been validated and its performance characteristics determined by the reporting laboratory.  This laboratory is certified under the Clinical Laboratory Improvement Amendments CLIA as qualified to perform high complexity clinical laboratory testing.    TRIMETH /SULFA  Value in next row Resistant      4 SENSITIVEThis is a modified FDA-approved test that has been validated and its performance characteristics determined by the reporting laboratory.  This laboratory is certified under the Clinical Laboratory Improvement Amendments CLIA as qualified to perform high complexity clinical laboratory testing.    AMPICILLIN /SULBACTAM Value in next row Intermediate      4 SENSITIVEThis is a modified FDA-approved test that has been validated and its performance characteristics determined by the reporting laboratory.  This laboratory is certified under the Clinical Laboratory Improvement Amendments CLIA  as qualified to perform high complexity clinical laboratory testing.    PIP/TAZO Value in next row Sensitive      <=4 SENSITIVEThis is a modified FDA-approved test that has been validated and its performance characteristics determined by the reporting laboratory.  This laboratory is certified under the Clinical Laboratory Improvement Amendments CLIA as qualified to perform high complexity clinical laboratory testing.    MEROPENEM Value in next row Sensitive      <=4 SENSITIVEThis is a modified FDA-approved test that has been validated and its performance characteristics determined by the reporting laboratory.  This laboratory is certified under the Clinical Laboratory Improvement Amendments CLIA as qualified to perform high complexity clinical laboratory testing.    * >=100,000 COLONIES/mL ESCHERICHIA COLI  Blood Culture ID Panel (Reflexed)     Status: Abnormal   Collection Time: 12/31/24  5:25 PM  Result Value Ref Range   Enterococcus faecalis NOT DETECTED NOT DETECTED   Enterococcus Faecium NOT DETECTED NOT DETECTED   Listeria monocytogenes NOT DETECTED NOT DETECTED   Staphylococcus species NOT DETECTED NOT DETECTED   Staphylococcus aureus (BCID) NOT DETECTED NOT DETECTED   Staphylococcus epidermidis NOT DETECTED NOT DETECTED   Staphylococcus lugdunensis NOT DETECTED NOT DETECTED   Streptococcus species NOT DETECTED NOT DETECTED   Streptococcus agalactiae NOT DETECTED NOT DETECTED   Streptococcus pneumoniae NOT DETECTED NOT DETECTED   Streptococcus pyogenes NOT DETECTED NOT DETECTED  A.calcoaceticus-baumannii NOT DETECTED NOT DETECTED   Bacteroides fragilis NOT DETECTED NOT DETECTED   Enterobacterales DETECTED (A) NOT DETECTED    Comment: Enterobacterales represent a large order of gram negative bacteria, not a single organism. CRITICAL RESULT CALLED TO, READ BACK BY AND VERIFIED WITH: PHARMD J.SHEEHAN AT 1053 ON 01/01/2025 BY T.SAAD.    Enterobacter cloacae complex NOT DETECTED NOT  DETECTED   Escherichia coli DETECTED (A) NOT DETECTED    Comment: CRITICAL RESULT CALLED TO, READ BACK BY AND VERIFIED WITH: PHARMD J.SHEEHAN AT 1053 ON 01/01/2025 BY T.SAAD.    Klebsiella aerogenes NOT DETECTED NOT DETECTED   Klebsiella oxytoca NOT DETECTED NOT DETECTED   Klebsiella pneumoniae NOT DETECTED NOT DETECTED   Proteus species NOT DETECTED NOT DETECTED   Salmonella species NOT DETECTED NOT DETECTED   Serratia marcescens NOT DETECTED NOT DETECTED   Haemophilus influenzae NOT DETECTED NOT DETECTED   Neisseria meningitidis NOT DETECTED NOT DETECTED   Pseudomonas aeruginosa NOT DETECTED NOT DETECTED   Stenotrophomonas maltophilia NOT DETECTED NOT DETECTED   Candida albicans NOT DETECTED NOT DETECTED   Candida auris NOT DETECTED NOT DETECTED   Candida glabrata NOT DETECTED NOT DETECTED   Candida krusei NOT DETECTED NOT DETECTED   Candida parapsilosis NOT DETECTED NOT DETECTED   Candida tropicalis NOT DETECTED NOT DETECTED   Cryptococcus neoformans/gattii NOT DETECTED NOT DETECTED   CTX-M ESBL NOT DETECTED NOT DETECTED   Carbapenem resistance IMP NOT DETECTED NOT DETECTED   Carbapenem resistance KPC NOT DETECTED NOT DETECTED   Carbapenem resistance NDM NOT DETECTED NOT DETECTED   Carbapenem resist OXA 48 LIKE NOT DETECTED NOT DETECTED   Carbapenem resistance VIM NOT DETECTED NOT DETECTED    Comment: Performed at [facility], [address] [place], [place] [address]  Culture, blood (Routine x 2)     Status: None (Preliminary result)   Collection Time: 12/31/24  5:30 PM   Specimen: BLOOD RIGHT HAND  Result Value Ref Range   Specimen Description      BLOOD RIGHT HAND Performed at [facility], [address], [place], [place] [address]    Special Requests      Blood Culture results may not be optimal due to an inadequate volume of blood received in culture bottles BOTTLES DRAWN AEROBIC AND ANAEROBIC Performed at [facility], [address], [place], [place] [address]    Culture  Setup Time      GRAM NEGATIVE RODS AEROBIC BOTTLE ONLY Performed at [facility], [address] [place], [place] [address]    Culture GRAM NEGATIVE RODS    Report Status PENDING   Lactic acid, plasma     Status: None   Collection Time: 12/31/24  8:56 PM  Result Value Ref Range   Lactic Acid, Venous 0.8 0.5 - 1.9 mmol/L    Comment: Performed at [facility], [address], [place], [place] [address]  Basic metabolic panel     Status: Abnormal   Collection Time: 01/01/25 10:55 AM  Result Value Ref Range   Sodium 132 (L) 135 - 145 mmol/L   Potassium 3.9 3.5 - 5.1 mmol/L   Chloride 97 (L) 98 - 111 mmol/L   CO2 22 22 - 32 mmol/L   Glucose, Bld 136 (H) 70 - 99 mg/dL    Comment: Glucose reference range applies only to samples taken after fasting for at least 8 hours.   BUN 16 6 - 20 mg/dL   Creatinine, Ser  1.52 (H) 0.44 - 1.00 mg/dL   Calcium 8.9 8.9 - 89.6 mg/dL   GFR, Estimated 42 (L) >60 mL/min    Comment: (NOTE) Calculated using the CKD-EPI Creatinine Equation (2021)    Anion gap 13 5 - 15    Comment: Performed at [facility], [address] [place], [place] [address]  Sodium, urine, random     Status: None   Collection Time: 01/01/25 12:07 PM  Result Value Ref Range   Sodium, Ur 107 mmol/L    Comment: NO NORMAL RANGE ESTABLISHED FOR THIS TEST Performed at [facility], [address], [place], [place] [address]   Creatinine, urine, random     Status: None   Collection Time: 01/01/25 12:07 PM  Result Value Ref Range   Creatinine, Urine 75 mg/dL    Comment: NO NORMAL RANGE ESTABLISHED FOR THIS TEST Performed at [facility], [address] [place], [place] [address]   Osmolality, urine     Status: None   Collection Time: 01/01/25 12:07 PM  Result Value Ref Range   Osmolality, Ur 434 300 - 900 mOsm/kg    Comment: Performed at [facility], [address]  [place], [place] [address]  Osmolality     Status: None   Collection Time: 01/01/25 12:07 PM  Result Value Ref Range   Osmolality 281 275 - 295 mOsm/kg    Comment: REPEATED TO VERIFY Performed at [facility], [address], [place], [place] [address]   CBC     Status: Abnormal   Collection Time: 01/02/25  6:41 AM  Result Value Ref Range   WBC 16.6 (H) 4.0 - 10.5 K/uL   RBC 4.02 3.87 - 5.11 MIL/uL   Hemoglobin 11.6 (L) 12.0 - 15.0 g/dL   HCT 66.0 (L) 63.9 - 53.9 %   MCV 84.3 80.0 - 100.0 fL   MCH 28.9 26.0 - 34.0 pg   MCHC 34.2 30.0 - 36.0 g/dL   RDW 86.0 88.4 - 84.4 %   Platelets 240 150 - 400 K/uL   nRBC 0.0 0.0 - 0.2 %    Comment: Performed at [facility], [address] [place], [place] [address]   No results found.  PMH:   Past Medical History:  Diagnosis Date   Chronic kidney disease    Gastritis     PSH:   Past Surgical History:  Procedure Laterality Date   CHOLECYSTECTOMY     KIDNEY TRANSPLANT  January2009   TUBAL LIGATION      Allergies: Allergies[1]  Medications:   Prior to Admission medications  Medication Sig Start Date End Date Taking? Authorizing Provider  acetaminophen  (TYLENOL ) 500 MG tablet Take 500 mg by mouth every 6 (six) hours as needed for pain.   Yes [provider]  cycloSPORINE  (SANDIMMUNE ) 100 MG/ML solution Take 90 mg by mouth 2 (two) times daily.   Yes [provider]  magnesium  oxide (MAG-OX) 400 MG tablet Take 400 mg by mouth daily. 10/21/19  Yes [provider]  mycophenolate  (CELLCEPT ) 500 MG tablet Take 500 mg by mouth 3 (three) times daily.   Yes [provider]  ondansetron  (ZOFRAN -ODT) 4 MG disintegrating tablet Take 1 tablet (4 mg total) by mouth every 8 (eight) hours as needed for up to 12 doses for nausea or vomiting. 03/05/24  Yes Trifan, Donnice PARAS, MD  predniSONE  (DELTASONE ) 5 MG tablet Take 5 mg by mouth daily.   Yes [provider]  sertraline  (ZOLOFT ) 50 MG  tablet Take 50 mg by mouth daily. 07/10/23  Yes [provider]  sucralfate  (CARAFATE ) 1 GM/10ML suspension Take 10 mLs (1 g total) by mouth 4 (four) times daily -  with meals and at bedtime. 07/27/23  Yes Raspet, Erin K, PA-C  sulfamethoxazole -trimethoprim  (BACTRIM  DS) 800-160 MG tablet Take 1 tablet by mouth 2 (two) times daily. 12/30/24  Yes Christopher Savannah, PA-C  azithromycin (ZITHROMAX) 500 MG tablet Take 500 mg by mouth daily. 12/22/24   [provider]  benzonatate  (TESSALON ) 100 MG capsule Take 1 capsule (100 mg total) by mouth every 8 (eight) hours. Patient not taking: Reported on 01/01/2025 11/16/24   Mayer, Jodi R, NP  famotidine  (PEPCID ) 40 MG tablet Take 40 mg by mouth daily. Patient not taking: Reported on 01/01/2025 07/10/23   [provider]  metoCLOPramide  (REGLAN ) 10 MG tablet Take 1 tablet (10 mg total) by mouth every 8 (eight) hours as needed for up to 10 doses for nausea. Patient not taking: Reported on 01/01/2025 03/05/24   Cottie Donnice PARAS, MD  ondansetron  (ZOFRAN -ODT) 4 MG disintegrating tablet Take 1 tablet (4 mg total) by mouth every 8 (eight) hours as needed for nausea or vomiting. Patient not taking: Reported on 07/28/2023 07/27/23   Raspet, Erin K, PA-C    Discontinued Meds:   Medications Discontinued During This Encounter  Medication Reason   mycophenolate  (CELLCEPT ) capsule 500 mg    ciprofloxacin  (CIPRO ) IVPB 400 mg    mycophenolate  (CELLCEPT ) tablet 500 mg    acetaminophen  (TYLENOL ) tablet 650 mg     Social History:  reports that she has never smoked. She has never used smokeless tobacco. She reports that she does not drink alcohol and does not use drugs.  Family History:  History reviewed. No pertinent family history.  Blood pressure 139/71, pulse 81, temperature 99.7 F (37.6 C), temperature source Oral, resp. rate 15, height 4' 9 (1.448 m), weight 70.8 kg, last menstrual period 07/23/2016, SpO2 98%. General appearance: alert, cooperative,  and appears stated age Head: Normocephalic, without obvious abnormality, atraumatic Eyes: negative Neck: no adenopathy, no carotid bruit, no JVD, supple, symmetrical, trachea midline, and thyroid not enlarged, symmetric, no tenderness/mass/nodules Back: symmetric, no curvature. ROM normal. No CVA tenderness. Resp: clear to auscultation bilaterally Cardio: S1, S2 normal GI: soft, non-tender; bowel sounds normal; no masses,  no organomegaly Pulses: 2+ and symmetric Skin: Skin color, texture, turgor normal. No rashes or lesions Neurologic: Grossly normal Access: Lt BCF + thrill       Joron Velis, LYNWOOD ORN, MD 01/02/2025, 7:48 AM      [1]  Allergies Allergen Reactions   Cephalosporins Anaphylaxis   Heparin  Itching    Pt reports she had an injection in the hospital 2 weeks ago and she was itching all over.

## 2025-01-02 NOTE — Progress Notes (Signed)
 " [facility] Progress Note  Patient: Sarah Dyer     FMW:[id number]  DOA: 12/31/2024   PCP: Waylan Almarie SAUNDERS, MD       Brief hospital course: 49 year old female with end-stage renal disease status post renal transplant in 2009, GERD and hyperlipidemia presented to the hospital with dysuria, urinary frequency, fever and chills.  She had been started on Bactrim  by urgent care for cystitis and had taken 2 doses but did not notice improvement.  She was started on antibiotics and admitted to the hospital. Urine culture from 1/27 noted to be positive for E. coli.  Blood cultures from 1/28 also returned positive for E. coli.  Subjective:  Headache is better today.  Last night she had a high fever and a severe headache. She has no other complainants.   Assessment and Plan: Principal Problem: Sepsis secondary to E. coli cystitis and bacteremia in immunocompromised patient - e coli resistant to Bactrim   - Continue Zosyn  based on sensitivities - She has persistent fevers and WBC has risen to 16 today  Active Problems:       Renal transplant - Nephrology has recommended to hold CellCept  for now - Continue cyclosporine  and prednisone   AKI - Baseline creatinine about 1.18 - Creatinine 1.52 on 1/29 - Improved slightly to 1.41 -States that she is drinking fluids well and voiding a lot-hold IV fluids  Hyponatremia -Follow     Code Status: Full Code Total time on patient care: 35 DVT prophylaxis:  enoxaparin  (LOVENOX ) injection 40 mg Start: 01/02/25 1800     Objective:   Vitals:   01/02/25 0138 01/02/25 0240 01/02/25 0435 01/02/25 0809  BP:   139/71 (!) 147/63  Pulse:   81 78  Resp:   15 20  Temp: (!) 102.2 F (39 C) 100.1 F (37.8 C) 99.7 F (37.6 C) (!) 101 F (38.3 C)  TempSrc: Oral Oral Oral   SpO2:   98% 98%  Weight:      Height:       Filed Weights   12/31/24 1722  Weight: 70.8 kg   Exam: General exam: Appears comfortable  HEENT: oral mucosa  moist Respiratory system: Clear to auscultation.  Cardiovascular system: S1 & S2 heard  Gastrointestinal system: Abdomen soft, non-tender, nondistended. Normal bowel sounds   Extremities: No cyanosis, clubbing or edema Psychiatry:  Mood & affect appropriate.      CBC: Recent Labs  Lab 12/31/24 1725 01/02/25 0641  WBC 10.5 16.6*  NEUTROABS 8.6*  --   HGB 12.4 11.6*  HCT 35.4* 33.9*  MCV 81.2 84.3  PLT 300 240   Basic Metabolic Panel: Recent Labs  Lab 12/31/24 1725 01/01/25 1055 01/02/25 0641  NA 129* 132* 129*  K 4.1 3.9 4.4  CL 92* 97* 96*  CO2 25 22 23   GLUCOSE 113* 136* 116*  BUN 21* 16 12  CREATININE 1.43* 1.52* 1.41*  CALCIUM 9.5 8.9 8.9     Scheduled Meds:  cycloSPORINE   90 mg Oral BID   enoxaparin  (LOVENOX ) injection  30 mg Subcutaneous Q24H   magnesium  oxide  400 mg Oral Daily   predniSONE   5 mg Oral Daily   sertraline   50 mg Oral Daily    Imaging and lab data personally reviewed   Author: Johnte Portnoy  01/02/2025 11:56 AM  To contact [facility]>   Check the care team in [facility] and look for the attending/consulting [facility] provider listed  Log into [email or web address] and use [facility]'s universal password  Go to> [facility]  and find provider  If you still have difficulty reaching the provider, please page the [facility] (Director on Call) for the Hospitalists listed on amion     "

## 2025-01-03 DIAGNOSIS — N179 Acute kidney failure, unspecified: Secondary | ICD-10-CM | POA: Diagnosis not present

## 2025-01-03 DIAGNOSIS — A4151 Sepsis due to Escherichia coli [E. coli]: Secondary | ICD-10-CM

## 2025-01-03 DIAGNOSIS — R6520 Severe sepsis without septic shock: Secondary | ICD-10-CM | POA: Diagnosis not present

## 2025-01-03 LAB — BASIC METABOLIC PANEL WITH GFR
Anion gap: 10 (ref 5–15)
BUN: 9 mg/dL (ref 6–20)
CO2: 23 mmol/L (ref 22–32)
Calcium: 9 mg/dL (ref 8.9–10.3)
Chloride: 98 mmol/L (ref 98–111)
Creatinine, Ser: 1.27 mg/dL — ABNORMAL HIGH (ref 0.44–1.00)
GFR, Estimated: 52 mL/min — ABNORMAL LOW
Glucose, Bld: 118 mg/dL — ABNORMAL HIGH (ref 70–99)
Potassium: 4.6 mmol/L (ref 3.5–5.1)
Sodium: 131 mmol/L — ABNORMAL LOW (ref 135–145)

## 2025-01-03 LAB — MISC LABCORP TEST (SEND OUT): Labcorp test code: 83935

## 2025-01-03 LAB — CBC
HCT: 33.7 % — ABNORMAL LOW (ref 36.0–46.0)
Hemoglobin: 11 g/dL — ABNORMAL LOW (ref 12.0–15.0)
MCH: 27.7 pg (ref 26.0–34.0)
MCHC: 32.6 g/dL (ref 30.0–36.0)
MCV: 84.9 fL (ref 80.0–100.0)
Platelets: 224 10*3/uL (ref 150–400)
RBC: 3.97 MIL/uL (ref 3.87–5.11)
RDW: 13.4 % (ref 11.5–15.5)
WBC: 13 10*3/uL — ABNORMAL HIGH (ref 4.0–10.5)
nRBC: 0 % (ref 0.0–0.2)

## 2025-01-03 MED ORDER — CARMEX CLASSIC LIP BALM EX OINT: 1.0000 | TOPICAL_OINTMENT | CUTANEOUS | Status: DC | PRN

## 2025-01-03 MED ORDER — MYCOPHENOLATE MOFETIL 250 MG PO CAPS
500.0000 mg | ORAL_CAPSULE | Freq: Two times a day (BID) | ORAL | Status: DC
  Administered 2025-01-04 – 2025-01-07 (×7): 500 mg via ORAL
  Filled 2025-01-03 (×7): qty 2

## 2025-01-03 NOTE — Progress Notes (Signed)
 Sarah Dyer is an 49 y.o. 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 Patient continued to have fever, chills, diaphoresis especially at night. She has also had headaches, nausea but has been able to keep her meds down. She denies any NSAID use and only uses Tylenol  for pain. Patient currently being treated with Zosyn .   Assessment/Plan: Renal transplant with CKDIIIa close to her baseline renal function  - she's concerned about the Cellcept  being off but used interpreter and explained that Cellcept  does increase risk of bacterial infection. Let's help her clear the infection and can restart the Cellcept  at twice a day dosing.   - She's already getting much better; will hold today and restart Cellcept  tomorrow. Cr getting better as well, likely was prerenal.  - Continue the CSA and Prednisone ; prednisone  will help with inflammatory response as well. CSA trough requested yest  Sepsis secondary to E.Coli systitis on Bactrim  (was resistant to Bactrim ) and will monitor clinical response. AKI on CKD IIIa not far off baseline likely secondary prerenal azotemia with poor oral intake associated with the UTI. No e/o rejection and no tenderness over transplant site. Hypertension - monitor for now GERD Headaches likely secondary to UTI  Subjective: Feeling much better; no more fevers, denies diarrhea, n/v/myalgias.   Chemistry and CBC: Creatinine, Ser  Date/Time Value Ref Range Status  01/03/2025 05:37 AM 1.27 (H) 0.44 - 1.00 mg/dL Final  98/69/7973 93:58 AM 1.41 (H) 0.44 - 1.00 mg/dL Final  98/70/7973 89:44 AM 1.52 (H) 0.44 - 1.00 mg/dL Final  98/71/7973 94:74 PM 1.43 (H) 0.44 - 1.00 mg/dL Final  95/97/7974 90:98 PM 1.18 (H) 0.44 - 1.00 mg/dL Final  91/74/7975 94:59 AM 1.29 (H) 0.44 - 1.00  mg/dL Final  91/75/7975 88:66 AM 1.10 (H) 0.44 - 1.00 mg/dL Final  91/76/7975 89:96 PM 1.43 (H) 0.44 - 1.00 mg/dL Final  91/76/7975 95:55 PM 1.49 (H) 0.44 - 1.00 mg/dL Final  95/80/7975 93:97 PM 1.20 (H) 0.44 - 1.00 mg/dL Final  94/78/7980 92:52 PM 1.00 0.44 - 1.00 mg/dL Final  91/78/7982 94:60 AM 1.02 (H) 0.44 - 1.00 mg/dL Final  91/79/7982 91:84 AM 1.26 (H) 0.44 - 1.00 mg/dL Final  91/70/7985 94:97 AM 1.08 0.50 - 1.10 mg/dL Final  91/71/7985 95:99 AM 0.93 0.50 - 1.10 mg/dL Final  91/72/7985 89:69 PM 1.05 0.50 - 1.10 mg/dL Final  91/77/7985 95:49 AM 1.04 0.50 - 1.10 mg/dL Final  91/78/7985 89:54 AM 1.09 0.50 - 1.10 mg/dL Final  91/79/7985 88:54 PM 1.00 0.50 - 1.10 mg/dL Final  89/90/7987 90:89 PM 1.08 0.50 - 1.10 mg/dL Final   Recent Labs  Lab 12/31/24 1725 01/01/25 1055 01/02/25 0641 01/03/25 0537  NA 129* 132* 129* 131*  K 4.1 3.9 4.4 4.6  CL 92* 97* 96* 98  CO2 25 22 23 23   GLUCOSE 113* 136* 116* 118*  BUN 21* 16 12 9   CREATININE 1.43* 1.52* 1.41* 1.27*  CALCIUM 9.5 8.9 8.9 9.0   Recent Labs  Lab 12/31/24 1725 01/02/25 0641 01/03/25 0537  WBC 10.5 16.6* 13.0*  NEUTROABS 8.6*  --   --   HGB 12.4 11.6* 11.0*  HCT 35.4* 33.9* 33.7*  MCV 81.2 84.3 84.9  PLT 300 240 224   Liver Function Tests: Recent Labs  Lab 12/31/24 1725 01/02/25 0641  AST 34 40  ALT 27 36  ALKPHOS 102 112  BILITOT 0.9 1.0  PROT 7.5 6.3*  ALBUMIN 4.5 3.5   No results for input(s): LIPASE, AMYLASE in the last 168 hours. No results for input(s): AMMONIA in the last 168 hours. Cardiac Enzymes: No results for input(s): CKTOTAL, CKMB, CKMBINDEX, TROPONINI in the last 168 hours. Iron Studies: No results for input(s): IRON, TIBC, TRANSFERRIN, FERRITIN in the last 72 hours. PT/INR: @LABRCNTIP (inr:5)  Xrays/Other Studies: ) Results for orders placed or performed during the hospital encounter of 12/31/24 (from the past 48 hours)  Basic metabolic panel     Status:  Abnormal   Collection Time: 01/01/25 10:55 AM  Result Value Ref Range   Sodium 132 (L) 135 - 145 mmol/L   Potassium 3.9 3.5 - 5.1 mmol/L   Chloride 97 (L) 98 - 111 mmol/L   CO2 22 22 - 32 mmol/L   Glucose, Bld 136 (H) 70 - 99 mg/dL    Comment: Glucose reference range applies only to samples taken after fasting for at least 8 hours.   BUN 16 6 - 20 mg/dL   Creatinine, Ser 8.47 (H) 0.44 - 1.00 mg/dL   Calcium 8.9 8.9 - 89.6 mg/dL   GFR, Estimated 42 (L) >60 mL/min    Comment: (NOTE) Calculated using the CKD-EPI Creatinine Equation (2021)    Anion gap 13 5 - 15    Comment: Performed at [facility], [address] [place], [place] [address]  Sodium, urine, random     Status: None   Collection Time: 01/01/25 12:07 PM  Result Value Ref Range   Sodium, Ur 107 mmol/L    Comment: NO NORMAL RANGE ESTABLISHED FOR THIS TEST Performed at [facility], [address], [place], [place] [address]   Creatinine, urine, random     Status: None   Collection Time: 01/01/25 12:07 PM  Result Value Ref Range   Creatinine, Urine 75 mg/dL    Comment: NO NORMAL RANGE ESTABLISHED FOR THIS TEST Performed at [facility], [address] [place], [place] [address]   Osmolality, urine     Status: None   Collection Time: 01/01/25 12:07 PM  Result Value Ref Range   Osmolality, Ur 434 300 - 900 mOsm/kg    Comment: Performed at [facility], [address] [place], [place] [address]  Osmolality     Status: None   Collection Time: 01/01/25 12:07 PM  Result Value Ref Range   Osmolality 281 275 - 295 mOsm/kg    Comment: REPEATED TO VERIFY Performed at [facility], [address] [place], [place] [address]   Miscellaneous [facility] test (send-out)     Status: None   Collection Time: 01/01/25  4:18 PM  Result Value Ref Range   [facility] test code [id number]    [facility] test name [id number]     Comment: Performed at [facility], [address],  [place], [place] [address]   Misc [facility] result COMMENT     Comment: (NOTE) Test Ordered: [id number] HIV Ab/p24 Ag with Reflex HIV Ab/p24 Ag Screen           Note:                     BN     Non Reactive  Reference Range: Non Reactive                          HIV-1/HIV-2 antibodies and HIV-1 p24 antigen were NOT detected. There is no laboratory evidence of HIV infection. HIV Negative Performed At: [facility] [address] [place], [place] [id number] Jennette Shorter MD Ey:[id number]   CBC     Status: Abnormal   Collection Time: 01/02/25  6:41 AM  Result Value Ref Range   WBC 16.6 (H) 4.0 - 10.5 K/uL   RBC 4.02 3.87 - 5.11 MIL/uL   Hemoglobin 11.6 (L) 12.0 - 15.0 g/dL   HCT 66.0 (L) 63.9 - 53.9 %   MCV 84.3 80.0 - 100.0 fL   MCH 28.9 26.0 - 34.0 pg   MCHC 34.2 30.0 - 36.0 g/dL   RDW 86.0 88.4 - 84.4 %   Platelets 240 150 - 400 K/uL   nRBC 0.0 0.0 - 0.2 %    Comment: Performed at [facility], [address], [place], [place] [address]  Comprehensive metabolic panel     Status: Abnormal   Collection Time: 01/02/25  6:41 AM  Result Value Ref Range   Sodium 129 (L) 135 - 145 mmol/L   Potassium 4.4 3.5 - 5.1 mmol/L   Chloride 96 (L) 98 - 111 mmol/L   CO2 23 22 - 32 mmol/L   Glucose, Bld 116 (H) 70 - 99 mg/dL    Comment: Glucose reference range applies only to samples taken after fasting for at least 8 hours.   BUN 12 6 - 20 mg/dL   Creatinine, Ser 8.58 (H) 0.44 - 1.00 mg/dL   Calcium 8.9 8.9 - 89.6 mg/dL   Total Protein 6.3 (L) 6.5 - 8.1 g/dL   Albumin 3.5 3.5 - 5.0 g/dL   AST 40 15 - 41 U/L   ALT 36 0 - 44 U/L   Alkaline Phosphatase 112 38 - 126 U/L   Total Bilirubin 1.0 0.0 - 1.2 mg/dL   GFR, Estimated 46 (L) >60 mL/min    Comment: (NOTE) Calculated using the CKD-EPI Creatinine Equation (2021)    Anion gap 10 5 - 15    Comment: Performed at [facility], [address]  [place], [place] [address]  CBC     Status: Abnormal   Collection Time: 01/03/25  5:37 AM  Result Value Ref Range   WBC 13.0 (H) 4.0 - 10.5 K/uL   RBC 3.97 3.87 - 5.11 MIL/uL   Hemoglobin 11.0 (L) 12.0 - 15.0 g/dL   HCT 66.2 (L) 63.9 - 53.9 %   MCV 84.9 80.0 - 100.0 fL   MCH 27.7 26.0 - 34.0 pg   MCHC 32.6 30.0 - 36.0 g/dL   RDW 86.5 88.4 - 84.4 %   Platelets 224 150 - 400 K/uL   nRBC 0.0 0.0 - 0.2 %    Comment: Performed at [facility], [address], [place], [place] [address]  Basic metabolic panel with GFR     Status: Abnormal   Collection Time: 01/03/25  5:37 AM  Result Value Ref Range   Sodium 131 (L) 135 - 145 mmol/L   Potassium 4.6 3.5 - 5.1 mmol/L   Chloride 98 98 - 111 mmol/L   CO2 23 22 - 32 mmol/L   Glucose, Bld 118 (H) 70 - 99 mg/dL    Comment: Glucose reference range applies only to samples taken after fasting for  at least 8 hours.   BUN 9 6 - 20 mg/dL   Creatinine, Ser 8.72 (H) 0.44 - 1.00 mg/dL   Calcium 9.0 8.9 - 89.6 mg/dL   GFR, Estimated 52 (L) >60 mL/min    Comment: (NOTE) Calculated using the CKD-EPI Creatinine Equation (2021)    Anion gap 10 5 - 15    Comment: Performed at [facility], [address], [place], [place] [address]   No results found.  PMH:   Past Medical History:  Diagnosis Date   Chronic kidney disease    Gastritis     PSH:   Past Surgical History:  Procedure Laterality Date   CHOLECYSTECTOMY     KIDNEY TRANSPLANT  January2009   TUBAL LIGATION      Allergies: Allergies[1]  Medications:   Prior to Admission medications  Medication Sig Start Date End Date Taking? Authorizing Provider  acetaminophen  (TYLENOL ) 500 MG tablet Take 500 mg by mouth every 6 (six) hours as needed for pain.   Yes [provider]  cycloSPORINE  (SANDIMMUNE ) 100 MG/ML solution Take 90 mg by mouth 2 (two) times daily.   Yes [provider]  magnesium  oxide (MAG-OX) 400 MG tablet Take 400 mg by mouth  daily. 10/21/19  Yes [provider]  mycophenolate  (CELLCEPT ) 500 MG tablet Take 500 mg by mouth 3 (three) times daily.   Yes [provider]  ondansetron  (ZOFRAN -ODT) 4 MG disintegrating tablet Take 1 tablet (4 mg total) by mouth every 8 (eight) hours as needed for up to 12 doses for nausea or vomiting. 03/05/24  Yes Cottie Donnice PARAS, MD  predniSONE  (DELTASONE ) 5 MG tablet Take 5 mg by mouth daily.   Yes [provider]  sertraline  (ZOLOFT ) 50 MG tablet Take 50 mg by mouth daily. 07/10/23  Yes [provider]  sucralfate  (CARAFATE ) 1 GM/10ML suspension Take 10 mLs (1 g total) by mouth 4 (four) times daily -  with meals and at bedtime. 07/27/23  Yes Raspet, Erin K, PA-C  sulfamethoxazole -trimethoprim  (BACTRIM  DS) 800-160 MG tablet Take 1 tablet by mouth 2 (two) times daily. 12/30/24  Yes Christopher Savannah, PA-C  azithromycin (ZITHROMAX) 500 MG tablet Take 500 mg by mouth daily. 12/22/24   [provider]  benzonatate  (TESSALON ) 100 MG capsule Take 1 capsule (100 mg total) by mouth every 8 (eight) hours. Patient not taking: Reported on 01/01/2025 11/16/24   Loreda Myla SAUNDERS, NP  famotidine  (PEPCID ) 40 MG tablet Take 40 mg by mouth daily. Patient not taking: Reported on 01/01/2025 07/10/23   [provider]  metoCLOPramide  (REGLAN ) 10 MG tablet Take 1 tablet (10 mg total) by mouth every 8 (eight) hours as needed for up to 10 doses for nausea. Patient not taking: Reported on 01/01/2025 03/05/24   Cottie Donnice PARAS, MD  ondansetron  (ZOFRAN -ODT) 4 MG disintegrating tablet Take 1 tablet (4 mg total) by mouth every 8 (eight) hours as needed for nausea or vomiting. Patient not taking: Reported on 07/28/2023 07/27/23   Raspet, Erin K, PA-C    Discontinued Meds:   Medications Discontinued During This Encounter  Medication Reason   mycophenolate  (CELLCEPT ) capsule 500 mg    ciprofloxacin  (CIPRO ) IVPB 400 mg    mycophenolate  (CELLCEPT ) tablet 500 mg    acetaminophen   (TYLENOL ) tablet 650 mg    mycophenolate  (CELLCEPT ) capsule 500 mg    enoxaparin  (LOVENOX ) injection 30 mg     Social History:  reports that she has never smoked. She has never used smokeless  tobacco. She reports that she does not drink alcohol and does not use drugs.  Family History:  History reviewed. No pertinent family history.  Blood pressure 126/67, pulse 61, temperature 98.6 F (37 C), temperature source Oral, resp. rate 20, height 4' 9 (1.448 m), weight 70.8 kg, last menstrual period 07/23/2016, SpO2 100%. Physical Exam: General appearance: NAD Head: NCAT Back: symmetric Resp: clear to auscultation bilaterally Cardio: S1, S2 normal GI: SNDNT+BS Pulses: 2+ and symmetric Skin: Skin color, texture, turgor normal. No rashes or lesions Neurologic: Grossly normal Access: Lt BCF + thrill     Duyen Beckom, LYNWOOD ORN, MD 01/03/2025, 8:01 AM      [1]  Allergies Allergen Reactions   Cephalosporins Anaphylaxis   Heparin  Itching    Pt reports she had an injection in the hospital 2 weeks ago and she was itching all over.

## 2025-01-03 NOTE — Progress Notes (Signed)
 " [facility] Progress Note  Patient: Sarah Dyer     FMW:[id number]  DOA: 12/31/2024   PCP: Waylan Almarie SAUNDERS, MD       Brief hospital course: 49 year old female with end-stage renal disease status post renal transplant in 2009, GERD and hyperlipidemia presented to the hospital with dysuria, urinary frequency, fever and chills.  She had been started on Bactrim  by urgent care for cystitis and had taken 2 doses but did not notice improvement.  She was started on antibiotics and admitted to the hospital. Urine culture from 1/27 noted to be positive for E. coli.  Blood cultures from 1/28 also returned positive for E. coli.  Subjective:  Fevers improving. No complaints today.   Assessment and Plan: Principal Problem: Sepsis secondary to E. coli cystitis and bacteremia in immunocompromised patient - E coli resistant to Bactrim   - Continue Zosyn  based on sensitivities - Fever resolved- WBC improving   Active Problems:       Renal transplant - Nephrology has recommended to hold CellCept  for now - Continue cyclosporine  and prednisone   AKI - Baseline creatinine about 1.18 - Creatinine 1.52 on 1/29 - Improving  -States that she is drinking fluids well and voiding a lot-hold IV fluids  Improved to 131 -Follow     Code Status: Full Code Total time on patient care: 35 DVT prophylaxis:  enoxaparin  (LOVENOX ) injection 40 mg Start: 01/02/25 1800     Objective:   Vitals:   01/02/25 2046 01/03/25 0101 01/03/25 0417 01/03/25 0928  BP: 139/77 135/65 126/67 (!) 144/70  Pulse: 68 69 61 76  Resp: 16 (!) 24 20 18   Temp: 100.1 F (37.8 C) 99.9 F (37.7 C) 98.6 F (37 C) 98.7 F (37.1 C)  TempSrc: Oral Oral Oral Oral  SpO2: 98% 98% 100% 100%  Weight:      Height:       Filed Weights   12/31/24 1722  Weight: 70.8 kg   Exam: General exam: Appears comfortable  HEENT: oral mucosa moist Respiratory system: Clear to auscultation.  Cardiovascular system: S1 & S2  heard  Gastrointestinal system: Abdomen soft, non-tender, nondistended. Normal bowel sounds   Extremities: No cyanosis, clubbing or edema Psychiatry:  Mood & affect appropriate.      CBC: Recent Labs  Lab 12/31/24 1725 01/02/25 0641 01/03/25 0537  WBC 10.5 16.6* 13.0*  NEUTROABS 8.6*  --   --   HGB 12.4 11.6* 11.0*  HCT 35.4* 33.9* 33.7*  MCV 81.2 84.3 84.9  PLT 300 240 224   Basic Metabolic Panel: Recent Labs  Lab 12/31/24 1725 01/01/25 1055 01/02/25 0641 01/03/25 0537  NA 129* 132* 129* 131*  K 4.1 3.9 4.4 4.6  CL 92* 97* 96* 98  CO2 25 22 23 23   GLUCOSE 113* 136* 116* 118*  BUN 21* 16 12 9   CREATININE 1.43* 1.52* 1.41* 1.27*  CALCIUM 9.5 8.9 8.9 9.0     Scheduled Meds:  cycloSPORINE   90 mg Oral BID   enoxaparin  (LOVENOX ) injection  40 mg Subcutaneous Q24H   magnesium  oxide  400 mg Oral Daily   [START ON 01/04/2025] mycophenolate   500 mg Oral BID   predniSONE   5 mg Oral Daily   sertraline   50 mg Oral Daily    Imaging and lab data personally reviewed   Author: Yahaira Bruski  01/03/2025 10:55 AM  To contact [facility]>   Check the care team in [facility] and look for the attending/consulting [facility] provider listed  Log into  [email or web address] and use [place]'s universal password   Go to> [facility]  and find provider  If you still have difficulty reaching the provider, please page the [facility] (Director on Call) for the Hospitalists listed on amion     "

## 2025-01-04 DIAGNOSIS — A4151 Sepsis due to Escherichia coli [E. coli]: Secondary | ICD-10-CM | POA: Diagnosis not present

## 2025-01-04 DIAGNOSIS — R6520 Severe sepsis without septic shock: Secondary | ICD-10-CM | POA: Diagnosis not present

## 2025-01-04 DIAGNOSIS — N179 Acute kidney failure, unspecified: Secondary | ICD-10-CM | POA: Diagnosis not present

## 2025-01-04 LAB — CBC
HCT: 33.2 % — ABNORMAL LOW (ref 36.0–46.0)
Hemoglobin: 11.3 g/dL — ABNORMAL LOW (ref 12.0–15.0)
MCH: 28.2 pg (ref 26.0–34.0)
MCHC: 34 g/dL (ref 30.0–36.0)
MCV: 82.8 fL (ref 80.0–100.0)
Platelets: 296 10*3/uL (ref 150–400)
RBC: 4.01 MIL/uL (ref 3.87–5.11)
RDW: 13.3 % (ref 11.5–15.5)
WBC: 9 10*3/uL (ref 4.0–10.5)
nRBC: 0 % (ref 0.0–0.2)

## 2025-01-04 LAB — BASIC METABOLIC PANEL WITH GFR
Anion gap: 11 (ref 5–15)
BUN: 17 mg/dL (ref 6–20)
CO2: 23 mmol/L (ref 22–32)
Calcium: 9.4 mg/dL (ref 8.9–10.3)
Chloride: 96 mmol/L — ABNORMAL LOW (ref 98–111)
Creatinine, Ser: 1.29 mg/dL — ABNORMAL HIGH (ref 0.44–1.00)
GFR, Estimated: 51 mL/min — ABNORMAL LOW
Glucose, Bld: 97 mg/dL (ref 70–99)
Potassium: 4.4 mmol/L (ref 3.5–5.1)
Sodium: 130 mmol/L — ABNORMAL LOW (ref 135–145)

## 2025-01-04 LAB — CYCLOSPORINE: Cyclosporine, LabCorp: 114 ng/mL (ref 100–400)

## 2025-01-04 NOTE — Progress Notes (Signed)
 " [facility] Progress Note  Patient: Sarah Dyer     FMW:[id number]  DOA: 12/31/2024   PCP: Waylan Almarie SAUNDERS, MD       Brief hospital course: 49 year old female with end-stage renal disease status post renal transplant in 2009, GERD and hyperlipidemia presented to the hospital with dysuria, urinary frequency, fever and chills.  She had been started on Bactrim  by urgent care for cystitis and had taken 2 doses but did not notice improvement.  She was started on antibiotics and admitted to the hospital. Urine culture from 1/27 noted to be positive for E. coli.  Blood cultures from 1/28 also returned positive for E. coli.  Subjective:  Feeling better torday.   Assessment and Plan: Principal Problem: Sepsis secondary to E. coli cystitis and bacteremia in immunocompromised patient - E coli resistant to Bactrim   - Continue Zosyn  based on sensitivities - Fever resolved- WBC improved to normal - Cellcept  being resumed today- cont to follow for fevers or leukocytosis   Active Problems:       Renal transplant - Nephrology resuming CellCept  today - Continue cyclosporine  and prednisone   AKI - Baseline creatinine about 1.18 - Creatinine 1.52 on 1/29 - Improving   Hyponatremia  - euvolemic - does not appear dehydrated or fluid overloaded       Code Status: Full Code Total time on patient care: 35 DVT prophylaxis:  enoxaparin  (LOVENOX ) injection 40 mg Start: 01/02/25 1800     Objective:   Vitals:   01/03/25 1322 01/03/25 1823 01/03/25 2121 01/04/25 0600  BP: 116/69 120/69 113/63 126/73  Pulse: 60 (!) 52 (!) 57 (!) 58  Resp: 20 16 20 16   Temp: 98.1 F (36.7 C) 98.3 F (36.8 C) 98.4 F (36.9 C) 97.7 F (36.5 C)  TempSrc: Axillary Oral Oral Oral  SpO2: 99% 98% 98% 100%  Weight:      Height:       Filed Weights   12/31/24 1722  Weight: 70.8 kg   Exam: General exam: Appears comfortable  HEENT: oral mucosa moist Respiratory system: Clear to  auscultation.  Cardiovascular system: S1 & S2 heard  Gastrointestinal system: Abdomen soft, non-tender, nondistended. Normal bowel sounds   Extremities: No cyanosis, clubbing or edema Psychiatry:  Mood & affect appropriate.      CBC: Recent Labs  Lab 12/31/24 1725 01/02/25 0641 01/03/25 0537 01/04/25 0630  WBC 10.5 16.6* 13.0* 9.0  NEUTROABS 8.6*  --   --   --   HGB 12.4 11.6* 11.0* 11.3*  HCT 35.4* 33.9* 33.7* 33.2*  MCV 81.2 84.3 84.9 82.8  PLT 300 240 224 296   Basic Metabolic Panel: Recent Labs  Lab 12/31/24 1725 01/01/25 1055 01/02/25 0641 01/03/25 0537 01/04/25 0630  NA 129* 132* 129* 131* 130*  K 4.1 3.9 4.4 4.6 4.4  CL 92* 97* 96* 98 96*  CO2 25 22 23 23 23   GLUCOSE 113* 136* 116* 118* 97  BUN 21* 16 12 9 17   CREATININE 1.43* 1.52* 1.41* 1.27* 1.29*  CALCIUM 9.5 8.9 8.9 9.0 9.4     Scheduled Meds:  cycloSPORINE   90 mg Oral BID   enoxaparin  (LOVENOX ) injection  40 mg Subcutaneous Q24H   magnesium  oxide  400 mg Oral Daily   mycophenolate   500 mg Oral BID   predniSONE   5 mg Oral Daily   sertraline   50 mg Oral Daily    Imaging and lab data personally reviewed   Author: Gyan Cambre  01/04/2025 11:42 AM  To contact [facility]>   Check the care team in [facility] and look for the attending/consulting [facility] provider listed  Log into [email or web address] and use [place]'s universal password   Go to> [facility]  and find provider  If you still have difficulty reaching the provider, please page the [facility] (Director on Call) for the Hospitalists listed on amion     "

## 2025-01-04 NOTE — Progress Notes (Signed)
 Sarah Dyer is an 49 y.o. 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 Patient continued to have fever, chills, diaphoresis especially at night. She has also had headaches, nausea but has been able to keep her meds down. She denies any NSAID use and only uses Tylenol  for pain. Patient currently being treated with Zosyn .   Assessment/Plan: Renal transplant with CKDIIIa close to her baseline renal function  - she's concerned about the Cellcept  being off but used interpreter and explained that Cellcept  does increase risk of bacterial infection. Let's help her clear the infection and can restart the Cellcept  at twice a day dosing.   - She's getting much better, no fevers overnight, tolerating PO; restarting Cellcept  today. Cr getting better as well, likely was prerenal. No lab resulted today but Cr down to 1.2's yest.  - Continue the CSA and Prednisone ; prednisone  will help with inflammatory response as well. CSA trough was sent on 1/30 and not resulted (goal 50-150)  Signing off at this time; please reconsult as needed.  Sepsis secondary to E.Coli systitis on Bactrim  (was resistant to Bactrim ) and will monitor clinical response. AKI on CKD IIIa not far off baseline likely secondary prerenal azotemia with poor oral intake associated with the UTI. No e/o rejection and no tenderness over transplant site. Hypertension - monitor for now GERD Headaches likely secondary to UTI  Subjective: Feeling much better; no more fevers, denies diarrhea, n/v/myalgias.   Chemistry and CBC: Creatinine, Ser  Date/Time Value Ref Range Status  01/03/2025 05:37 AM 1.27 (H) 0.44 - 1.00 mg/dL Final  98/69/7973 93:58 AM 1.41 (H) 0.44 - 1.00 mg/dL Final  98/70/7973 89:44 AM 1.52 (H) 0.44 - 1.00 mg/dL Final   98/71/7973 94:74 PM 1.43 (H) 0.44 - 1.00 mg/dL Final  95/97/7974 90:98 PM 1.18 (H) 0.44 - 1.00 mg/dL Final  91/74/7975 94:59 AM 1.29 (H) 0.44 - 1.00 mg/dL Final  91/75/7975 88:66 AM 1.10 (H) 0.44 - 1.00 mg/dL Final  91/76/7975 89:96 PM 1.43 (H) 0.44 - 1.00 mg/dL Final  91/76/7975 95:55 PM 1.49 (H) 0.44 - 1.00 mg/dL Final  95/80/7975 93:97 PM 1.20 (H) 0.44 - 1.00 mg/dL Final  94/78/7980 92:52 PM 1.00 0.44 - 1.00 mg/dL Final  91/78/7982 94:60 AM 1.02 (H) 0.44 - 1.00 mg/dL Final  91/79/7982 91:84 AM 1.26 (H) 0.44 - 1.00 mg/dL Final  91/70/7985 94:97 AM 1.08 0.50 - 1.10 mg/dL Final  91/71/7985 95:99 AM 0.93 0.50 - 1.10 mg/dL Final  91/72/7985 89:69 PM 1.05 0.50 - 1.10 mg/dL Final  91/77/7985 95:49 AM 1.04 0.50 - 1.10 mg/dL Final  91/78/7985 89:54 AM 1.09 0.50 - 1.10 mg/dL Final  91/79/7985 88:54 PM 1.00 0.50 - 1.10 mg/dL Final  89/90/7987 90:89 PM 1.08 0.50 - 1.10 mg/dL Final   Recent Labs  Lab 12/31/24 1725 01/01/25 1055 01/02/25 0641 01/03/25 0537  NA 129* 132* 129* 131*  K 4.1 3.9 4.4 4.6  CL 92* 97* 96* 98  CO2 25 22 23 23   GLUCOSE 113* 136* 116* 118*  BUN 21* 16 12 9   CREATININE 1.43* 1.52* 1.41* 1.27*  CALCIUM 9.5 8.9 8.9 9.0   Recent Labs  Lab 12/31/24 1725 01/02/25 0641 01/03/25 0537  WBC 10.5 16.6* 13.0*  NEUTROABS 8.6*  --   --   HGB 12.4 11.6* 11.0*  HCT 35.4* 33.9* 33.7*  MCV 81.2 84.3 84.9  PLT 300 240 224   Liver Function Tests: Recent Labs  Lab 12/31/24 1725 01/02/25 0641  AST 34 40  ALT 27 36  ALKPHOS 102 112  BILITOT 0.9 1.0  PROT 7.5 6.3*  ALBUMIN 4.5 3.5   No results for input(s): LIPASE, AMYLASE in the last 168 hours. No results for input(s): AMMONIA in the last 168 hours. Cardiac Enzymes: No results for input(s): CKTOTAL, CKMB, CKMBINDEX, TROPONINI in the last 168 hours. Iron Studies: No results for input(s): IRON, TIBC, TRANSFERRIN, FERRITIN in the last 72 hours. PT/INR: @LABRCNTIP (inr:5)  Xrays/Other  Studies: ) Results for orders placed or performed during the hospital encounter of 12/31/24 (from the past 48 hours)  CBC     Status: Abnormal   Collection Time: 01/03/25  5:37 AM  Result Value Ref Range   WBC 13.0 (H) 4.0 - 10.5 K/uL   RBC 3.97 3.87 - 5.11 MIL/uL   Hemoglobin 11.0 (L) 12.0 - 15.0 g/dL   HCT 66.2 (L) 63.9 - 53.9 %   MCV 84.9 80.0 - 100.0 fL   MCH 27.7 26.0 - 34.0 pg   MCHC 32.6 30.0 - 36.0 g/dL   RDW 86.5 88.4 - 84.4 %   Platelets 224 150 - 400 K/uL   nRBC 0.0 0.0 - 0.2 %    Comment: Performed at [facility], [address], [place], [place] [address]  Basic metabolic panel with GFR     Status: Abnormal   Collection Time: 01/03/25  5:37 AM  Result Value Ref Range   Sodium 131 (L) 135 - 145 mmol/L   Potassium 4.6 3.5 - 5.1 mmol/L   Chloride 98 98 - 111 mmol/L   CO2 23 22 - 32 mmol/L   Glucose, Bld 118 (H) 70 - 99 mg/dL    Comment: Glucose reference range applies only to samples taken after fasting for at least 8 hours.   BUN 9 6 - 20 mg/dL   Creatinine, Ser 8.72 (H) 0.44 - 1.00 mg/dL   Calcium 9.0 8.9 - 89.6 mg/dL   GFR, Estimated 52 (L) >60 mL/min    Comment: (NOTE) Calculated using the CKD-EPI Creatinine Equation (2021)    Anion gap 10 5 - 15    Comment: Performed at [facility], [address] [place], [place] [address]   No results found.  PMH:   Past Medical History:  Diagnosis Date   Chronic kidney disease    Gastritis     PSH:   Past Surgical History:  Procedure Laterality Date   CHOLECYSTECTOMY     KIDNEY TRANSPLANT  January2009   TUBAL LIGATION      Allergies: Allergies[1]  Medications:   Prior to Admission medications  Medication Sig Start Date End Date Taking? Authorizing Provider  acetaminophen  (TYLENOL ) 500 MG tablet Take 500 mg by mouth every 6 (six) hours as needed for pain.   Yes [provider]  cycloSPORINE  (SANDIMMUNE ) 100 MG/ML solution Take 90 mg by mouth 2 (two) times  daily.   Yes [provider]  magnesium  oxide (MAG-OX) 400 MG tablet Take 400 mg by mouth daily. 10/21/19  Yes [provider]  mycophenolate  (CELLCEPT ) 500 MG tablet Take 500 mg by mouth 3 (three) times daily.   Yes [provider]  ondansetron  (ZOFRAN -ODT) 4 MG disintegrating tablet Take 1 tablet (4 mg total) by mouth every 8 (eight) hours as needed for up to 12 doses for nausea or vomiting. 03/05/24  Yes Trifan, Donnice PARAS, MD  predniSONE  (DELTASONE ) 5 MG tablet Take 5 mg by mouth daily.   Yes [provider]  sertraline  (ZOLOFT ) 50 MG tablet Take 50 mg by mouth daily. 07/10/23  Yes [provider]  sucralfate  (CARAFATE ) 1 GM/10ML suspension Take 10 mLs (1 g total) by mouth 4 (four) times daily -  with meals and at bedtime. 07/27/23  Yes Raspet, Erin K, PA-C  sulfamethoxazole -trimethoprim  (BACTRIM  DS) 800-160 MG tablet Take 1 tablet by mouth 2 (two) times daily. 12/30/24  Yes Christopher Savannah, PA-C  azithromycin (ZITHROMAX) 500 MG tablet Take 500 mg by mouth daily. 12/22/24   [provider]  benzonatate  (TESSALON ) 100 MG capsule Take 1 capsule (100 mg total) by mouth every 8 (eight) hours. Patient not taking: Reported on 01/01/2025 11/16/24   Mayer, Jodi R, NP  famotidine  (PEPCID ) 40 MG tablet Take 40 mg by mouth daily. Patient not taking: Reported on 01/01/2025 07/10/23   [provider]  metoCLOPramide  (REGLAN ) 10 MG tablet Take 1 tablet (10 mg total) by mouth every 8 (eight) hours as needed for up to 10 doses for nausea. Patient not taking: Reported on 01/01/2025 03/05/24   Cottie Donnice PARAS, MD  ondansetron  (ZOFRAN -ODT) 4 MG disintegrating tablet Take 1 tablet (4 mg total) by mouth every 8 (eight) hours as needed for nausea or vomiting. Patient not taking: Reported on 07/28/2023 07/27/23   Raspet, Erin K, PA-C    Discontinued Meds:   Medications Discontinued During This Encounter  Medication Reason   mycophenolate  (CELLCEPT ) capsule 500 mg     ciprofloxacin  (CIPRO ) IVPB 400 mg    mycophenolate  (CELLCEPT ) tablet 500 mg    acetaminophen  (TYLENOL ) tablet 650 mg    mycophenolate  (CELLCEPT ) capsule 500 mg    enoxaparin  (LOVENOX ) injection 30 mg     Social History:  reports that she has never smoked. She has never used smokeless tobacco. She reports that she does not drink alcohol and does not use drugs.  Family History:  History reviewed. No pertinent family history.  Blood pressure 126/73, pulse (!) 58, temperature 97.7 F (36.5 C), temperature source Oral, resp. rate 16, height 4' 9 (1.448 m), weight 70.8 kg, last menstrual period 07/23/2016, SpO2 100%. Physical Exam: General appearance: NAD Head: NCAT Back: symmetric Resp: clear to auscultation bilaterally Cardio: S1, S2 normal GI: SNDNT+BS Pulses: 2+ and symmetric Skin: Skin color, texture, turgor normal. No rashes or lesions Neurologic: Grossly normal Access: Lt BCF + thrill     Darcee Dekker, LYNWOOD ORN, MD 01/04/2025, 7:19 AM      [1]  Allergies Allergen Reactions   Cephalosporins Anaphylaxis   Heparin  Itching    Pt reports she had an injection in the hospital 2 weeks ago and she was itching all over.

## 2025-01-05 DIAGNOSIS — R6520 Severe sepsis without septic shock: Secondary | ICD-10-CM | POA: Diagnosis not present

## 2025-01-05 DIAGNOSIS — A4151 Sepsis due to Escherichia coli [E. coli]: Secondary | ICD-10-CM | POA: Diagnosis not present

## 2025-01-05 DIAGNOSIS — N179 Acute kidney failure, unspecified: Secondary | ICD-10-CM | POA: Diagnosis not present

## 2025-01-05 LAB — CBC
HCT: 34 % — ABNORMAL LOW (ref 36.0–46.0)
Hemoglobin: 11.1 g/dL — ABNORMAL LOW (ref 12.0–15.0)
MCH: 27.5 pg (ref 26.0–34.0)
MCHC: 32.6 g/dL (ref 30.0–36.0)
MCV: 84.2 fL (ref 80.0–100.0)
Platelets: 334 10*3/uL (ref 150–400)
RBC: 4.04 MIL/uL (ref 3.87–5.11)
RDW: 13.2 % (ref 11.5–15.5)
WBC: 6.9 10*3/uL (ref 4.0–10.5)
nRBC: 0 % (ref 0.0–0.2)

## 2025-01-05 LAB — COMPREHENSIVE METABOLIC PANEL WITH GFR
ALT: 19 U/L (ref 0–44)
AST: 17 U/L (ref 15–41)
Albumin: 3.5 g/dL (ref 3.5–5.0)
Alkaline Phosphatase: 96 U/L (ref 38–126)
Anion gap: 12 (ref 5–15)
BUN: 19 mg/dL (ref 6–20)
CO2: 21 mmol/L — ABNORMAL LOW (ref 22–32)
Calcium: 9.4 mg/dL (ref 8.9–10.3)
Chloride: 100 mmol/L (ref 98–111)
Creatinine, Ser: 1.4 mg/dL — ABNORMAL HIGH (ref 0.44–1.00)
GFR, Estimated: 46 mL/min — ABNORMAL LOW
Glucose, Bld: 99 mg/dL (ref 70–99)
Potassium: 4.7 mmol/L (ref 3.5–5.1)
Sodium: 133 mmol/L — ABNORMAL LOW (ref 135–145)
Total Bilirubin: 0.5 mg/dL (ref 0.0–1.2)
Total Protein: 6.7 g/dL (ref 6.5–8.1)

## 2025-01-05 MED ORDER — SODIUM CHLORIDE 0.9 % IV SOLN
1.0000 g | INTRAVENOUS | Status: AC
  Administered 2025-01-05 – 2025-01-07 (×3): 1 g via INTRAVENOUS
  Filled 2025-01-05 (×3): qty 1000

## 2025-01-06 DIAGNOSIS — R6520 Severe sepsis without septic shock: Secondary | ICD-10-CM | POA: Diagnosis not present

## 2025-01-06 DIAGNOSIS — N179 Acute kidney failure, unspecified: Secondary | ICD-10-CM | POA: Diagnosis not present

## 2025-01-06 DIAGNOSIS — A4151 Sepsis due to Escherichia coli [E. coli]: Secondary | ICD-10-CM | POA: Diagnosis not present

## 2025-01-06 LAB — CULTURE, BLOOD (ROUTINE X 2)

## 2025-01-07 NOTE — TOC Transition Note (Signed)
 Transition of Care [facility]) - Discharge Note   Patient Details  Name: Sarah Dyer MRN: [id number] Date of Birth: [birth year]  Transition of [facility]) CM/SW Contact:  Bascom Service, RN Phone Number: 01/07/2025, 1:39 PM   Clinical Narrative: d/c home.      Final next level of care: Home/Self Care Barriers to Discharge: No Barriers Identified   Patient Goals and CMS Choice Patient states their goals for this hospitalization and ongoing recovery are:: Home CMS Medicare.gov Compare Post Acute Care list provided to:: Patient Choice offered to / list presented to : Patient      Discharge Placement                       Discharge Plan and Services Additional resources added to the After Visit Summary for                                       Social Drivers of Health (SDOH) Interventions SDOH Screenings   Food Insecurity: No Food Insecurity (01/01/2025)  Housing: Low Risk (01/01/2025)  Transportation Needs: No Transportation Needs (01/01/2025)  Utilities: Not At Risk (01/01/2025)  Tobacco Use: Low Risk (12/31/2024)     Readmission Risk Interventions     No data to display

## 2025-01-07 NOTE — Progress Notes (Signed)
 Reviewed discharge instructions with patient including medications.. Patient verbalized understanding. Katniss Weedman, Lonell Louder, RN

## 2025-01-07 NOTE — Discharge Summary (Signed)
 " Physician Discharge Summary   Patient: Sarah Dyer Dyer MRN: [id number] DOB: Dec 26, 1975  Admit date:     12/31/2024  Discharge date: 01/07/25  Discharge Physician: Lonni KANDICE Moose   PCP: Waylan Almarie SAUNDERS, MD      Recommendations at discharge:   Follow-up with nephrologist in 1 to 2 weeks.  Discharge Diagnoses: Principal Problem:   Sepsis due to Escherichia coli (E. coli) (HCC) Active Problems:   Acute cystitis   Renal transplant, status post   Immunosuppression   GERD (gastroesophageal reflux disease)   Chronic kidney disease (CKD), stage III (moderate) (HCC)   Hyponatremia  Resolved Problems:   * No resolved hospital problems. *  Hospital Course:  49 year old female with end-stage renal disease status post renal transplant in 2009, GERD and hyperlipidemia presented to the hospital with dysuria, urinary frequency, fever and chills.  She had been started on Bactrim  by urgent care for cystitis and had taken 2 doses but did not notice improvement.  She was started on antibiotics and admitted to the hospital. Urine culture from 1/27 noted to be positive for E. coli.  Blood cultures from 1/28 also returned positive for E. coli.  Blood and urine cultures were sensitive to cefepime, Zosyn , and ertapenem .  Patient received 4 days IV Zosyn  and 3 days of ertapenem .  At the time of discharge she was afebrile hemodynamically stable clinically improved.  Back on her immunosuppressive's.  Assessment and Plan: Acute cystitis + dysuria, increased urinary frequency with fever and failed outpatient course of antibiotics UA indicative of infection  Noted 12/30/24 Urine culture w/ E coli (sensitivities pending)  Chronic immunosuppression is a confounding issue  Started on IV cipro  in the ER in setting of severe PCN allergy Will continue  Repeat urine culture  Monitor     Hyponatremia Sodium 129 on presentation Noted current sepsis associated with UTI Mildly dry to euvolemic  on presentation IV fluid hydration Trend sodium Goal 6-8 mill equivalents changed over the next 12 to 24 hours  Chronic kidney disease (CKD), stage III (moderate) (HCC) Baseline creatinine 1.2-1.4 with GFR in the 40s Creatinine 1.4 today Appears near baseline Minimize nephrotoxic agents in the setting of renal transplant IV fluid hydration in the setting of concurrent sepsis Follow closely  GERD (gastroesophageal reflux disease) On pepcid  and carafate    Renal transplant, status post Baseline history of ESRD s/p renal cell transplant 2009 in [place]  On immunosuppressant regimen including cyclosporine , cellcept , prednisone   Cont regimen  Titrate regimen w/ nephrology input as appropriate  Follow           Consultants: DR. Lynwood Farr, Nephrology Procedures performed: none Disposition: Home Diet recommendation:  Discharge Diet Orders (From admission, onward)     Start     Ordered   01/07/25 0000  Diet general        01/07/25 1318           Carb modified diet DISCHARGE MEDICATION: Allergies as of 01/07/2025       Reactions   Cephalosporins Anaphylaxis   Heparin  Itching   Pt reports she had an injection in the hospital 2 weeks ago and she was itching all over.         Medication List     STOP taking these medications    azithromycin 500 MG tablet Commonly known as: ZITHROMAX   sulfamethoxazole -trimethoprim  800-160 MG tablet Commonly known as: BACTRIM  DS       TAKE these medications    acetaminophen  500 MG tablet  Commonly known as: TYLENOL  Take 500 mg by mouth every 6 (six) hours as needed for pain.   benzonatate  100 MG capsule Commonly known as: TESSALON  Take 1 capsule (100 mg total) by mouth every 8 (eight) hours.   cycloSPORINE  100 MG/ML solution Commonly known as: SANDIMMUNE  Take 90 mg by mouth 2 (two) times daily.   famotidine  40 MG tablet Commonly known as: PEPCID  Take 40 mg by mouth daily.   magnesium  oxide 400 MG tablet Commonly  known as: MAG-OX Take 400 mg by mouth daily.   metoCLOPramide  10 MG tablet Commonly known as: REGLAN  Take 1 tablet (10 mg total) by mouth every 8 (eight) hours as needed for up to 10 doses for nausea.   mycophenolate  500 MG tablet Commonly known as: CELLCEPT  Take 500 mg by mouth 3 (three) times daily.   ondansetron  4 MG disintegrating tablet Commonly known as: ZOFRAN -ODT Take 1 tablet (4 mg total) by mouth every 8 (eight) hours as needed for up to 12 doses for nausea or vomiting. What changed: Another medication with the same name was removed. Continue taking this medication, and follow the directions you see here.   predniSONE  5 MG tablet Commonly known as: DELTASONE  Take 5 mg by mouth daily.   sertraline  50 MG tablet Commonly known as: ZOLOFT  Take 50 mg by mouth daily.   sucralfate  1 GM/10ML suspension Commonly known as: Carafate  Take 10 mLs (1 g total) by mouth 4 (four) times daily -  with meals and at bedtime.        Discharge Exam: Filed Weights   12/31/24 1722  Weight: 70.8 kg   Awake, alert, nontoxic HEENT is pharynx moist and pink CV: Regular rate and rhythm S1-2 no murmurs rubs gallops Lung: Clear air entry Abdomen: Soft, nontender, nondistended, splenic and right, no CVA tenderness Extremities: No sinus clubbing or edema Neurologic: Awake alert oriented x 3 conversant moves all extremities spontaneously  Condition at discharge: good  The results of significant diagnostics from this hospitalization (including imaging, microbiology, ancillary and laboratory) are listed below for reference.   Imaging Studies: No results found.  Microbiology: Results for orders placed or performed during the hospital encounter of 12/31/24  Culture, blood (Routine x 2)     Status: Abnormal   Collection Time: 12/31/24  5:25 PM   Specimen: BLOOD  Result Value Ref Range Status   Specimen Description   Final    BLOOD RIGHT ANTECUBITAL Performed at [facility], [address] [place], [place] [address]    Special Requests   Final    Blood Culture results may not be optimal due to an inadequate volume of blood received in culture bottles BOTTLES DRAWN AEROBIC AND ANAEROBIC Performed at [facility], [address] [place], [place] [address]    Culture  Setup Time   Final    CORRECTED RESULTS GRAM POSITIVE RODS ANAEROBIC BOTTLE ONLY PREVIOUSLY REPORTED AS: GRAM NEGATIVE RODS CRITICAL RESULT CALLED TO, READ BACK BY AND VERIFIED WITH: C. MAS RN, AT 9090 01/01/25 D. JEARLEAN GRAM NEGATIVE RODS IN BOTH AEROBIC AND ANAEROBIC BOTTLES CRITICAL RESULT CALLED TO, READ BACK BY AND VERIFIED WITH: RN J.SHEEHAN AT 1053 ON 01/01/2025 BY T.SAAD. CORRECTED RESULTS CALLED TO: PHARMD S.DAVIS AT 1131 ON 01/06/2025 BY T.SAAD. Performed at [facility], [address] [place], [place] [address]    Culture ESCHERICHIA COLI (A)  Final   Report Status 01/06/2025 FINAL  Final   Organism ID, Bacteria ESCHERICHIA COLI  Final  Susceptibility   Escherichia coli - MIC*    AMPICILLIN  >=32 RESISTANT Resistant     CEFAZOLIN (NON-URINE) 4 INTERMEDIATE Intermediate     CEFEPIME <=0.12 SENSITIVE Sensitive     ERTAPENEM  <=0.12 SENSITIVE Sensitive     CEFTRIAXONE <=0.25 SENSITIVE Sensitive     CIPROFLOXACIN  >=4 RESISTANT Resistant     GENTAMICIN <=1 SENSITIVE Sensitive     MEROPENEM <=0.25 SENSITIVE Sensitive     TRIMETH /SULFA  >=320 RESISTANT Resistant     AMPICILLIN /SULBACTAM 16 INTERMEDIATE Intermediate     PIP/TAZO Value in next row Sensitive      <=4 SENSITIVEThis is a modified FDA-approved test that has been validated and its performance characteristics determined by the reporting laboratory.  This laboratory is certified under the Clinical Laboratory Improvement Amendments CLIA as qualified to perform high complexity clinical laboratory testing.    * ESCHERICHIA COLI  Urine Culture     Status: Abnormal   Collection Time:  12/31/24  5:25 PM   Specimen: Urine, Random  Result Value Ref Range Status   Specimen Description   Final    URINE, RANDOM Performed at [facility], [address] [place], [place] [address]    Special Requests   Final    NONE Reflexed from [id number] Performed at [facility], [address] [place], [place] [address]    Culture >=100,000 COLONIES/mL ESCHERICHIA COLI (A)  Final   Report Status 01/02/2025 FINAL  Final   Organism ID, Bacteria ESCHERICHIA COLI (A)  Final      Susceptibility   Escherichia coli - MIC*    AMPICILLIN  >=32 RESISTANT Resistant     CEFAZOLIN (URINE) Value in next row Sensitive      4 SENSITIVEThis is a modified FDA-approved test that has been validated and its performance characteristics determined by the reporting laboratory.  This laboratory is certified under the Clinical Laboratory Improvement Amendments CLIA as qualified to perform high complexity clinical laboratory testing.    CEFEPIME Value in next row Sensitive      4 SENSITIVEThis is a modified FDA-approved test that has been validated and its performance characteristics determined by the reporting laboratory.  This laboratory is certified under the Clinical Laboratory Improvement Amendments CLIA as qualified to perform high complexity clinical laboratory testing.    ERTAPENEM  Value in next row Sensitive      4 SENSITIVEThis is a modified FDA-approved test that has been validated and its performance characteristics determined by the reporting laboratory.  This laboratory is certified under the Clinical Laboratory Improvement Amendments CLIA as qualified to perform high complexity clinical laboratory testing.    CEFTRIAXONE Value in next row Sensitive      4 SENSITIVEThis is a modified FDA-approved test that has been validated and its performance characteristics determined by the reporting laboratory.  This laboratory is certified under the Clinical Laboratory  Improvement Amendments CLIA as qualified to perform high complexity clinical laboratory testing.    CIPROFLOXACIN  Value in next row Resistant      4 SENSITIVEThis is a modified FDA-approved test that has been validated and its performance characteristics determined by the reporting laboratory.  This laboratory is certified under the Clinical Laboratory Improvement Amendments CLIA as qualified to perform high complexity clinical laboratory testing.    GENTAMICIN Value in next row Sensitive      4 SENSITIVEThis is a modified FDA-approved test that has been validated and its performance characteristics determined by the reporting laboratory.  This laboratory is certified under the Clinical Laboratory Improvement Amendments  CLIA as qualified to perform high complexity clinical laboratory testing.    NITROFURANTOIN Value in next row Sensitive      4 SENSITIVEThis is a modified FDA-approved test that has been validated and its performance characteristics determined by the reporting laboratory.  This laboratory is certified under the Clinical Laboratory Improvement Amendments CLIA as qualified to perform high complexity clinical laboratory testing.    TRIMETH /SULFA  Value in next row Resistant      4 SENSITIVEThis is a modified FDA-approved test that has been validated and its performance characteristics determined by the reporting laboratory.  This laboratory is certified under the Clinical Laboratory Improvement Amendments CLIA as qualified to perform high complexity clinical laboratory testing.    AMPICILLIN /SULBACTAM Value in next row Intermediate      4 SENSITIVEThis is a modified FDA-approved test that has been validated and its performance characteristics determined by the reporting laboratory.  This laboratory is certified under the Clinical Laboratory Improvement Amendments CLIA as qualified to perform high complexity clinical laboratory testing.    PIP/TAZO Value in next row Sensitive      <=4  SENSITIVEThis is a modified FDA-approved test that has been validated and its performance characteristics determined by the reporting laboratory.  This laboratory is certified under the Clinical Laboratory Improvement Amendments CLIA as qualified to perform high complexity clinical laboratory testing.    MEROPENEM Value in next row Sensitive      <=4 SENSITIVEThis is a modified FDA-approved test that has been validated and its performance characteristics determined by the reporting laboratory.  This laboratory is certified under the Clinical Laboratory Improvement Amendments CLIA as qualified to perform high complexity clinical laboratory testing.    * >=100,000 COLONIES/mL ESCHERICHIA COLI  Blood Culture ID Panel (Reflexed)     Status: Abnormal   Collection Time: 12/31/24  5:25 PM  Result Value Ref Range Status   Enterococcus faecalis NOT DETECTED NOT DETECTED Final   Enterococcus Faecium NOT DETECTED NOT DETECTED Final   Listeria monocytogenes NOT DETECTED NOT DETECTED Final   Staphylococcus species NOT DETECTED NOT DETECTED Final   Staphylococcus aureus (BCID) NOT DETECTED NOT DETECTED Final   Staphylococcus epidermidis NOT DETECTED NOT DETECTED Final   Staphylococcus lugdunensis NOT DETECTED NOT DETECTED Final   Streptococcus species NOT DETECTED NOT DETECTED Final   Streptococcus agalactiae NOT DETECTED NOT DETECTED Final   Streptococcus pneumoniae NOT DETECTED NOT DETECTED Final   Streptococcus pyogenes NOT DETECTED NOT DETECTED Final   A.calcoaceticus-baumannii NOT DETECTED NOT DETECTED Final   Bacteroides fragilis NOT DETECTED NOT DETECTED Final   Enterobacterales DETECTED (A) NOT DETECTED Final    Comment: Enterobacterales represent a large order of gram negative bacteria, not a single organism. CRITICAL RESULT CALLED TO, READ BACK BY AND VERIFIED WITH: PHARMD J.SHEEHAN AT 1053 ON 01/01/2025 BY T.SAAD.    Enterobacter cloacae complex NOT DETECTED NOT DETECTED Final   Escherichia  coli DETECTED (A) NOT DETECTED Final    Comment: CRITICAL RESULT CALLED TO, READ BACK BY AND VERIFIED WITH: PHARMD J.SHEEHAN AT 1053 ON 01/01/2025 BY T.SAAD.    Klebsiella aerogenes NOT DETECTED NOT DETECTED Final   Klebsiella oxytoca NOT DETECTED NOT DETECTED Final   Klebsiella pneumoniae NOT DETECTED NOT DETECTED Final   Proteus species NOT DETECTED NOT DETECTED Final   Salmonella species NOT DETECTED NOT DETECTED Final   Serratia marcescens NOT DETECTED NOT DETECTED Final   Haemophilus influenzae NOT DETECTED NOT DETECTED Final   Neisseria meningitidis NOT DETECTED NOT DETECTED Final   Pseudomonas aeruginosa  NOT DETECTED NOT DETECTED Final   Stenotrophomonas maltophilia NOT DETECTED NOT DETECTED Final   Candida albicans NOT DETECTED NOT DETECTED Final   Candida auris NOT DETECTED NOT DETECTED Final   Candida glabrata NOT DETECTED NOT DETECTED Final   Candida krusei NOT DETECTED NOT DETECTED Final   Candida parapsilosis NOT DETECTED NOT DETECTED Final   Candida tropicalis NOT DETECTED NOT DETECTED Final   Cryptococcus neoformans/gattii NOT DETECTED NOT DETECTED Final   CTX-M ESBL NOT DETECTED NOT DETECTED Final   Carbapenem resistance IMP NOT DETECTED NOT DETECTED Final   Carbapenem resistance KPC NOT DETECTED NOT DETECTED Final   Carbapenem resistance NDM NOT DETECTED NOT DETECTED Final   Carbapenem resist OXA 48 LIKE NOT DETECTED NOT DETECTED Final   Carbapenem resistance VIM NOT DETECTED NOT DETECTED Final    Comment: Performed at [facility], [address] [place], [place] [address]  Culture, blood (Routine x 2)     Status: Abnormal   Collection Time: 12/31/24  5:30 PM   Specimen: BLOOD RIGHT HAND  Result Value Ref Range Status   Specimen Description   Final    BLOOD RIGHT HAND Performed at [facility], [address] [place], [place] [address]    Special Requests   Final    Blood Culture results may not be optimal due to an inadequate volume of blood  received in culture bottles BOTTLES DRAWN AEROBIC AND ANAEROBIC Performed at [facility], [address], [place], [place] [address]    Culture  Setup Time GRAM NEGATIVE RODS AEROBIC BOTTLE ONLY   Final   Culture (A)  Final    ESCHERICHIA COLI SUSCEPTIBILITIES PERFORMED ON PREVIOUS CULTURE WITHIN THE LAST 5 DAYS. Performed at [facility], [address] [place], [place] [address]    Report Status 01/06/2025 FINAL  Final    Labs: CBC: Recent Labs  Lab 01/02/25 0641 01/03/25 0537 01/04/25 0630 01/05/25 0438  WBC 16.6* 13.0* 9.0 6.9  HGB 11.6* 11.0* 11.3* 11.1*  HCT 33.9* 33.7* 33.2* 34.0*  MCV 84.3 84.9 82.8 84.2  PLT 240 224 296 334   Basic Metabolic Panel: Recent Labs  Lab 01/01/25 1055 01/02/25 0641 01/03/25 0537 01/04/25 0630 01/05/25 0438  NA 132* 129* 131* 130* 133*  K 3.9 4.4 4.6 4.4 4.7  CL 97* 96* 98 96* 100  CO2 22 23 23 23  21*  GLUCOSE 136* 116* 118* 97 99  BUN 16 12 9 17 19   CREATININE 1.52* 1.41* 1.27* 1.29* 1.40*  CALCIUM 8.9 8.9 9.0 9.4 9.4   Liver Function Tests: Recent Labs  Lab 01/02/25 0641 01/05/25 0438  AST 40 17  ALT 36 19  ALKPHOS 112 96  BILITOT 1.0 0.5  PROT 6.3* 6.7  ALBUMIN 3.5 3.5   CBG: No results for input(s): GLUCAP in the last 168 hours.  Discharge time spent: greater than 30 minutes.  Signed: Lonni KANDICE Moose, MD [facility] 01/07/2025 "
